# Patient Record
Sex: Male | Born: 1970 | Race: White | Hispanic: No | Marital: Married | Smoking: Never smoker
Health system: Southern US, Community
[De-identification: ages and names within clinical notes are randomized; demographics above are authoritative.]

## PROBLEM LIST (undated history)

## (undated) DIAGNOSIS — Z801 Family history of malignant neoplasm of trachea, bronchus and lung: Secondary | ICD-10-CM

## (undated) DIAGNOSIS — K219 Gastro-esophageal reflux disease without esophagitis: Secondary | ICD-10-CM

## (undated) DIAGNOSIS — T7840XA Allergy, unspecified, initial encounter: Secondary | ICD-10-CM

## (undated) DIAGNOSIS — K635 Polyp of colon: Secondary | ICD-10-CM

## (undated) DIAGNOSIS — J309 Allergic rhinitis, unspecified: Secondary | ICD-10-CM

## (undated) DIAGNOSIS — Z800 Family history of malignant neoplasm of digestive organs: Secondary | ICD-10-CM

## (undated) DIAGNOSIS — L719 Rosacea, unspecified: Secondary | ICD-10-CM

## (undated) DIAGNOSIS — E7800 Pure hypercholesterolemia, unspecified: Secondary | ICD-10-CM

## (undated) HISTORY — PX: UPPER GASTROINTESTINAL ENDOSCOPY: SHX188

## (undated) HISTORY — DX: Pure hypercholesterolemia, unspecified: E78.00

## (undated) HISTORY — DX: Gastro-esophageal reflux disease without esophagitis: K21.9

## (undated) HISTORY — DX: Family history of malignant neoplasm of digestive organs: Z80.0

## (undated) HISTORY — DX: Family history of malignant neoplasm of trachea, bronchus and lung: Z80.1

## (undated) HISTORY — DX: Rosacea, unspecified: L71.9

## (undated) HISTORY — PX: FINGER SURGERY: SHX640

## (undated) HISTORY — DX: Allergy, unspecified, initial encounter: T78.40XA

## (undated) HISTORY — PX: NASAL SINUS SURGERY: SHX719

## (undated) HISTORY — DX: Polyp of colon: K63.5

## (undated) HISTORY — DX: Allergic rhinitis, unspecified: J30.9

## (undated) HISTORY — PX: COLONOSCOPY: SHX174

---

## 2004-05-04 ENCOUNTER — Ambulatory Visit (HOSPITAL_COMMUNITY): Admission: RE | Admit: 2004-05-04 | Discharge: 2004-05-04 | Payer: Self-pay | Admitting: Internal Medicine

## 2004-09-15 HISTORY — PX: TONSILLECTOMY: SUR1361

## 2006-01-02 ENCOUNTER — Ambulatory Visit (HOSPITAL_BASED_OUTPATIENT_CLINIC_OR_DEPARTMENT_OTHER): Admission: RE | Admit: 2006-01-02 | Discharge: 2006-01-02 | Payer: Self-pay | Admitting: Otolaryngology

## 2006-01-02 ENCOUNTER — Encounter (INDEPENDENT_AMBULATORY_CARE_PROVIDER_SITE_OTHER): Payer: Self-pay | Admitting: *Deleted

## 2006-05-25 ENCOUNTER — Ambulatory Visit (HOSPITAL_COMMUNITY): Admission: RE | Admit: 2006-05-25 | Discharge: 2006-05-25 | Payer: Self-pay | Admitting: Internal Medicine

## 2006-11-16 ENCOUNTER — Ambulatory Visit (HOSPITAL_COMMUNITY): Admission: RE | Admit: 2006-11-16 | Discharge: 2006-11-16 | Payer: Self-pay | Admitting: Internal Medicine

## 2009-08-29 ENCOUNTER — Ambulatory Visit (HOSPITAL_COMMUNITY): Admission: RE | Admit: 2009-08-29 | Discharge: 2009-08-29 | Payer: Self-pay | Admitting: Internal Medicine

## 2011-01-31 NOTE — Op Note (Signed)
NAMEAKHIL, PISCOPO                 ACCOUNT NO.:  [id number]   MEDICAL RECORD NO.:  [id number]          PATIENT TYPE:  AMB   LOCATION:  DSC                          FACILITY:  [facility]   PHYSICIAN:  Lucky Cowboy, MD         DATE OF BIRTH:  07/10/1971   DATE OF PROCEDURE:  01/02/2006  DATE OF DISCHARGE:  01/02/2006                                 OPERATIVE REPORT   PREOPERATIVE DIAGNOSIS:  Septal deviation, bilateral inferior turbinate  hypertrophy, tonsillar hypertrophy.   POSTOPERATIVE DIAGNOSIS:  Septal deviation, bilateral inferior turbinate  hypertrophy, tonsillar hypertrophy.   PROCEDURE:  Septoplasty, bilateral inferior turbinate reductions,  tonsillectomy.   SURGEON:  Dr. Lucky Cowboy.   ANESTHESIA:  General.   ESTIMATED BLOOD LOSS:  Less than 20 cc.   SPECIMENS:  Bilateral palatine tonsils.   COMPLICATIONS:  None.   INDICATIONS:  The patient is a 40 year old male who was noted to have a  severe left septal deviation with obstructing inferior turbinate hypertrophy  causing nasal obstruction which failed to be relieved with medical therapy.  Further, he is noted to have an asymmetric right-sided tonsil with bilateral  tonsillar hypertrophy and halitosis from tonsillolith accumulation.  For  these reasons, the above procedures are performed.   FINDINGS:  The patient was noted to have a severe left septal deviation with  obstructing inferior turbinate hypertrophy had markedly enlarged tonsils  which were filled with tonsillolith.   PROCEDURE:  The patient was taken to the operating room and placed on the  table in the supine position.  He was then placed under general endotracheal  anesthesia and the nasal surgery performed.  The table was rotated  counterclockwise 90 degrees.  The nose was prepped with Betadine and draped  in the usual sterile fashion.  One percent lidocaine with 1:100,000  epinephrine was used to inject both sides of the nasal cavity.  After  allowing  time for vasoconstrictive effect, the left hemitransfixion incision  was made using a #15 blade.  Submucoperichondrial and mucoperiosteal flaps  were elevated.  The bony cartilaginous junction was divided using a Market researcher and contralateral flaps elevated.  Posterior portion of the septum  was carefully taken down using the open Jansen-Middleton forceps superiorly.  Takahashi forceps were used to take down redundant posterior nasal bone.  At  this point, the septal spur was taken down using the 4-mm osteotome.  This  allowed medialization of the septum.  The hemitransfixion incision was  closed using 4-0 chromic.  A horizontal mattress stitch was applied using 4-  0 chromic gut.  Attention was then turned to both of the inferior  turbinates.  Both these were injected with the same local anesthetic.  The  microdebrider was used to debride the inferior half of both the inferior  turbinates.  Redundant bone was taken down, leaving the superior half of the  inferior turbinates bilaterally.  The bone was taken down using the through-  cut forceps.  Suction cautery used for hemostasis.  Each side of the nasal  cavities was packed with Bactroban-coated  Telfa, tied to one another  anterior to the columella.  The oral surgery was performed next.  A Crowe-  Davis mouth gag with a #4 tongue blade was then placed intraorally, opened  and suspended on the Mayo stand.  The right palatine tonsil was grasped with  Allis clamps and directed inferior medially.  The Harmonic scalpel was then  used to excise the tonsil, staying within the peritonsillar space adjacent  to the tonsillar capsule.  The left palatine tonsil was removed in identical  fashion, and they were sent separately to pathology.  An NG tube was placed  on the esophagus for suctioning of the gastric contents.  The mouth gag was  removed noting no damage to the teeth or soft tissues.  Table was rotated  clockwise 90 degrees its original  position.  The patient was awakened from  anesthesia and taken to the post-anesthesia care unit in stable condition.  No complications.      Lucky Cowboy, MD  Electronically Signed     SJ/MEDQ  D:  02/05/2006  T:  02/06/2006  Job:  [id number]   cc:   [facility]

## 2015-05-23 DIAGNOSIS — J309 Allergic rhinitis, unspecified: Principal | ICD-10-CM

## 2015-05-23 DIAGNOSIS — K219 Gastro-esophageal reflux disease without esophagitis: Secondary | ICD-10-CM | POA: Insufficient documentation

## 2015-05-23 DIAGNOSIS — H1010 Acute atopic conjunctivitis, unspecified eye: Secondary | ICD-10-CM | POA: Insufficient documentation

## 2015-06-26 ENCOUNTER — Encounter (INDEPENDENT_AMBULATORY_CARE_PROVIDER_SITE_OTHER): Payer: Self-pay

## 2015-06-26 ENCOUNTER — Ambulatory Visit (INDEPENDENT_AMBULATORY_CARE_PROVIDER_SITE_OTHER): Payer: BLUE CROSS/BLUE SHIELD | Admitting: Allergy and Immunology

## 2015-06-26 ENCOUNTER — Encounter: Payer: Self-pay | Admitting: Allergy and Immunology

## 2015-06-26 VITALS — BP 128/82 | HR 80 | Temp 98.1°F | Resp 16 | Ht 75.59 in | Wt 220.2 lb

## 2015-06-26 DIAGNOSIS — J3089 Other allergic rhinitis: Secondary | ICD-10-CM | POA: Diagnosis not present

## 2015-06-26 NOTE — Progress Notes (Signed)
[place]  Follow-up Note    Subjective:   Barry Daniels is a 44 y.o. male who returns to the [facility] in re-evaluation of the following:  HPI Comments:  [name] returns to this clinic in re-evaluation of his allergic rhinitis presently treated with immunotherapy established 2005. He had an excellent response to immunotherapy and was able to extend his interval out to every four weeks. However over the past year he has redeveloped rhinitic symptoms that initially responded to montelukast but have progressed requiring him to also start a nasal steroid. There is no obvious trigger, environmental change, medication that can account for his increased disease activity.     Current outpatient prescriptions:  .  fexofenadine (ALLEGRA) 180 MG tablet, Take 180 mg by mouth daily., Disp: , Rfl:  .  Glucosamine-Chondroitin (GLUCOSAMINE CHONDR COMPLEX PO), Take 1 tablet by mouth daily., Disp: , Rfl:  .  montelukast (SINGULAIR) 10 MG tablet, Take 10 mg by mouth at bedtime., Disp: , Rfl:  .  omeprazole (PRILOSEC) 20 MG capsule, Take 1 capsule by mouth every morning., Disp: , Rfl: 0 .  triamcinolone (NASACORT ALLERGY 24HR) 55 MCG/ACT AERO nasal inhaler, Place 2 sprays into the nose daily., Disp: , Rfl:  .  mometasone (NASONEX) 50 MCG/ACT nasal spray, Place 2 sprays into the nose daily., Disp: , Rfl:   No orders of the defined types were placed in this encounter.    Past Medical History  Diagnosis Date  . Rosacea   . Allergic rhinitis   . Hypercholesteremia   . GERD (gastroesophageal reflux disease)     History reviewed. No pertinent past surgical history.  No Known Allergies  Review of Systems  HENT: Positive for congestion. Negative for nosebleeds and sore throat.   Eyes: Negative for discharge and redness.  Respiratory: Negative for cough.   Neurological: Negative for headaches.     Objective:   Filed Vitals:    06/26/15 0851  BP: 128/82  Pulse: 80  Temp: 98.1 F (36.7 C)  Resp: 16    Physical Exam  Constitutional: He is well-developed, well-nourished, and in no distress. No distress.  HENT:  Head: Normocephalic and atraumatic. Head is without right periorbital erythema and without left periorbital erythema.  Right Ear: Tympanic membrane, external ear and ear canal normal. No drainage. No foreign bodies. Tympanic membrane is not injected, not scarred, not perforated, not erythematous, not retracted and not bulging. No middle ear effusion.  Left Ear: Tympanic membrane, external ear and ear canal normal. No drainage. No foreign bodies. Tympanic membrane is not injected, not scarred, not perforated, not erythematous, not retracted and not bulging.  No middle ear effusion.  Nose: Nose normal. No mucosal edema, rhinorrhea, nose lacerations, sinus tenderness, nasal deformity, septal deviation or nasal septal hematoma. No epistaxis.  Mouth/Throat: Oropharynx is clear and moist and mucous membranes are normal. No oropharyngeal exudate, posterior oropharyngeal edema, posterior oropharyngeal erythema or tonsillar abscesses.  Eyes: Conjunctivae and lids are normal. Pupils are equal, round, and reactive to light. Right eye exhibits no discharge and no exudate. No foreign body present in the right eye. Left eye exhibits no discharge and no exudate. No foreign body present in the left eye. Right conjunctiva is not injected. Right conjunctiva has no hemorrhage. Left conjunctiva is not injected. Left conjunctiva has no hemorrhage. No scleral icterus.  Neck: No tracheal tenderness present. No tracheal deviation present.  Cardiovascular: Normal rate, regular rhythm,  S1 normal, S2 normal and normal heart sounds.  Exam reveals no gallop and no friction rub.   No murmur heard. Pulmonary/Chest: Effort normal. No respiratory distress. He has no wheezes. He has no rhonchi. He has no rales. He exhibits no tenderness.   Lymphadenopathy:    He has no cervical adenopathy.  Skin: No purpura and no rash noted. Rash is not macular, not maculopapular, not nodular, not pustular, not vesicular and not urticarial. He is not diaphoretic. No cyanosis or erythema. No pallor. Nails show no clubbing.  Psychiatric: Mood and affect normal.    Diagnostics: Allergy skin tests were performed and demonstrated positives to hickory, pecan, and cockroach on epicutaneous prick and dust mite on intradermal.     Assessment and Plan:   1. Other allergic rhinitis         1. Allergen avoidance measures  2. Treat inflammation with the following:   A. OTC Rhinocort / Nasocort - one - two sprays each nostril one time per day  B. Montelukast 10mg  - one tablet one time per day  3. If needed:   A. OTC antihistamine  B. Nasal saline   4. Continue immunotherapy and Epi-Pen. Reformulate with Dust Mite and hickory / pecan  5. Annual fall flu vaccine  6. Further evaluation?    Allena Katz, MD [place]

## 2015-06-26 NOTE — Patient Instructions (Signed)
  1. Allergen avoidance measures  2. Treat inflammation with the following:   A. OTC Rhinocort / Nasocort - one - two sprays each nostril one time per day  B. Montelukast 10mg  - one tablet one time per day  3. If needed:   A. OTC antihistamine  B. Nasal saline   4. Continue immunotherapy and Epi-Pen. May need re-formulation.  5. Annual fall flu vaccine  6. Further evaluation?

## 2015-07-03 ENCOUNTER — Other Ambulatory Visit: Payer: Self-pay | Admitting: Allergy and Immunology

## 2015-07-03 DIAGNOSIS — J3089 Other allergic rhinitis: Secondary | ICD-10-CM

## 2015-07-03 DIAGNOSIS — J309 Allergic rhinitis, unspecified: Secondary | ICD-10-CM

## 2015-07-05 DIAGNOSIS — J301 Allergic rhinitis due to pollen: Secondary | ICD-10-CM | POA: Diagnosis not present

## 2015-07-06 DIAGNOSIS — J3089 Other allergic rhinitis: Secondary | ICD-10-CM | POA: Diagnosis not present

## 2015-11-20 ENCOUNTER — Ambulatory Visit (INDEPENDENT_AMBULATORY_CARE_PROVIDER_SITE_OTHER): Payer: BLUE CROSS/BLUE SHIELD

## 2015-11-20 DIAGNOSIS — J309 Allergic rhinitis, unspecified: Secondary | ICD-10-CM | POA: Diagnosis not present

## 2015-11-20 NOTE — Progress Notes (Signed)
Immunotherapy   Patient Details  Name: Barry Daniels MRN: [id number] Date of Birth: November 14, 1970  11/20/2015  [name] started injections for Blue 1:100,000 (dmite and tree) @ .05 Following schedule: C   Frequency:1 time per week Epi-Pen:Epi-Pen Avaiable  Consent signed and patient instructions given.   [name] 11/20/2015, 9:41 AM

## 2016-01-31 ENCOUNTER — Ambulatory Visit (INDEPENDENT_AMBULATORY_CARE_PROVIDER_SITE_OTHER): Payer: BLUE CROSS/BLUE SHIELD

## 2016-01-31 DIAGNOSIS — J309 Allergic rhinitis, unspecified: Secondary | ICD-10-CM

## 2016-01-31 NOTE — Progress Notes (Signed)
Immunotherapy   Patient Details  Name: LEONIDES SWILLEY MRN: [id number] Date of Birth: Apr 10, 1971  01/31/2016  [name] A Deloach here to pick up GOLD 1:10,000 (DMITE & TREES) @ 0.10 GIVEN Following schedule: C  Frequency:1 time per week Epi-Pen:Epi-Pen Available  Consent signed and patient instructions given. NO PROBLEMS   Seraya Jobst 01/31/2016, 9:24 AM

## 2016-04-17 ENCOUNTER — Ambulatory Visit (INDEPENDENT_AMBULATORY_CARE_PROVIDER_SITE_OTHER): Payer: BLUE CROSS/BLUE SHIELD

## 2016-04-17 DIAGNOSIS — J309 Allergic rhinitis, unspecified: Secondary | ICD-10-CM

## 2016-04-17 NOTE — Progress Notes (Signed)
Immunotherapy   Patient Details  Name: Barry Daniels MRN: [id number] Date of Birth: [birth year]  04/17/2016  [name] here to pick up Green 1:1000 (DMite and Tree) Following schedule: C  Frequency:1 time per week Epi-Pen:Epi-Pen Available  Consent signed and patient instructions given.    Alisse Tuite 04/17/2016, 10:10 AM

## 2016-06-04 DIAGNOSIS — J301 Allergic rhinitis due to pollen: Secondary | ICD-10-CM | POA: Diagnosis not present

## 2016-06-05 DIAGNOSIS — J3089 Other allergic rhinitis: Secondary | ICD-10-CM | POA: Diagnosis not present

## 2016-06-12 ENCOUNTER — Telehealth: Payer: Self-pay | Admitting: *Deleted

## 2016-06-12 ENCOUNTER — Ambulatory Visit (INDEPENDENT_AMBULATORY_CARE_PROVIDER_SITE_OTHER): Payer: BLUE CROSS/BLUE SHIELD | Admitting: *Deleted

## 2016-06-12 DIAGNOSIS — J309 Allergic rhinitis, unspecified: Secondary | ICD-10-CM

## 2016-06-12 NOTE — Telephone Encounter (Signed)
Patient is on his first red vial and he wants to know if at the 0.50 can he go monthly. Please advise. Patient aware Dr. Neldon Mc is out of the office the rest of the week.

## 2016-06-16 NOTE — Telephone Encounter (Signed)
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

## 2016-06-17 NOTE — Telephone Encounter (Signed)
LM FOR PT TO CALL US BACK

## 2016-06-17 NOTE — Telephone Encounter (Signed)
DOCUMENTED IN PATIENTS CHART WILL NOTIFY PATIENT

## 2016-06-19 NOTE — Telephone Encounter (Signed)
Patient informed. 

## 2017-01-07 NOTE — Progress Notes (Signed)
Vials to be made 01-07-17 jm

## 2017-01-08 DIAGNOSIS — J3089 Other allergic rhinitis: Secondary | ICD-10-CM | POA: Diagnosis not present

## 2017-01-15 NOTE — Addendum Note (Signed)
Addended by: Felipa Emory on: 01/15/2017 01:49 PM   Modules accepted: Orders

## 2017-01-15 NOTE — Addendum Note (Signed)
Addended by: Felipa Emory on: 01/15/2017 12:22 PM   Modules accepted: Orders

## 2017-01-23 ENCOUNTER — Ambulatory Visit: Payer: BLUE CROSS/BLUE SHIELD

## 2017-01-23 ENCOUNTER — Ambulatory Visit (INDEPENDENT_AMBULATORY_CARE_PROVIDER_SITE_OTHER): Payer: BLUE CROSS/BLUE SHIELD

## 2017-01-23 DIAGNOSIS — J309 Allergic rhinitis, unspecified: Secondary | ICD-10-CM

## 2017-01-23 NOTE — Progress Notes (Signed)
Immunotherapy   Patient Details  Name: Barry Daniels MRN: [id number] Date of Birth: April 25, 1971  01/23/2017  [name] here to pick up  trees and d-mites Following schedule: C  Frequency:1 time per week Epi-Pen:Epi-Pen Available  Consent signed and patient instructions given.   [name] 01/23/2017, 10:46 AM

## 2017-04-03 ENCOUNTER — Telehealth: Payer: Self-pay

## 2017-04-03 NOTE — Telephone Encounter (Signed)
Patient called stating he broke one of his vials, he is unsure which one it was. Pt is in his red vial. He would like to just receive a whole new set of both vials so he can stay on schedule.   Patient would like to come in on August 3 @ 9 am. To pick up new vials. I have put the patient on the schedule for this date.  Please Advise

## 2017-04-06 NOTE — Telephone Encounter (Signed)
Can Someone Please Advise  Thanks

## 2017-04-07 NOTE — Telephone Encounter (Signed)
Added to vial list.

## 2017-04-07 NOTE — Telephone Encounter (Signed)
Left message letting patient know vials will be available for pick up at appointment.

## 2017-04-09 DIAGNOSIS — J301 Allergic rhinitis due to pollen: Secondary | ICD-10-CM | POA: Diagnosis not present

## 2017-04-09 NOTE — Telephone Encounter (Signed)
Left message to inform him that vials will be ready for pick up at appointment.

## 2017-04-10 DIAGNOSIS — J3089 Other allergic rhinitis: Secondary | ICD-10-CM | POA: Diagnosis not present

## 2017-04-17 ENCOUNTER — Ambulatory Visit: Payer: BLUE CROSS/BLUE SHIELD

## 2017-04-17 ENCOUNTER — Ambulatory Visit (INDEPENDENT_AMBULATORY_CARE_PROVIDER_SITE_OTHER): Payer: BLUE CROSS/BLUE SHIELD

## 2017-04-17 DIAGNOSIS — J309 Allergic rhinitis, unspecified: Secondary | ICD-10-CM

## 2017-04-17 NOTE — Progress Notes (Deleted)
Patient came by today to pick up his new allergy vials-Barry Daniels and Barry Daniels.  Exp: 04/10/2018  He received an Injection in office and waited 30 minutes with no issues. He brought out-of-office paperwork and was given a new set to take with him.

## 2017-04-17 NOTE — Progress Notes (Signed)
Immunotherapy   Patient Details  Name: Barry Daniels MRN: [id number] Date of Birth: Jan 20, 1971  04/17/2017  [name] A Mccaffery here to pick up  vials for [name] and [name]. Exp: 04/10/18 Following schedule: C  Frequency:1 time per week Epi-Pen:Epi-Pen Available   Pt waited 30 minutes with no issues. He was given out-of-office paperwork along with his vials.    [name] 04/17/2017, 9:22 AM

## 2017-11-10 NOTE — Progress Notes (Signed)
VIALS EXP 11-11-18

## 2017-11-11 DIAGNOSIS — J301 Allergic rhinitis due to pollen: Secondary | ICD-10-CM | POA: Diagnosis not present

## 2017-11-17 ENCOUNTER — Ambulatory Visit (INDEPENDENT_AMBULATORY_CARE_PROVIDER_SITE_OTHER): Payer: BLUE CROSS/BLUE SHIELD | Admitting: *Deleted

## 2017-11-17 DIAGNOSIS — J309 Allergic rhinitis, unspecified: Secondary | ICD-10-CM

## 2017-11-23 NOTE — Progress Notes (Signed)
Immunotherapy   Patient Details  Name: Barry Daniels MRN: [id number] Date of Birth: 1970-10-09  11/19/2017  [name] A Hoxworth picked up vials for [place]. Following schedule: C  Frequency:1 time per week @ 0.50 every 4 weeks. Epi-Pen:Epi-Pen Available  Consent signed and patient instructions given. 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   Horris Latino 11/19/2017, 10:47 AM

## 2018-06-09 ENCOUNTER — Telehealth: Payer: Self-pay

## 2018-06-09 NOTE — Telephone Encounter (Signed)
Patient called and stated that he will need new vials soon. Patient scheduled an appointment to pick his vials up on 06/25/2018 at 9am

## 2018-06-10 NOTE — Progress Notes (Signed)
Vials exp 06-11-19

## 2018-06-11 DIAGNOSIS — J301 Allergic rhinitis due to pollen: Secondary | ICD-10-CM | POA: Diagnosis not present

## 2018-06-25 ENCOUNTER — Ambulatory Visit (INDEPENDENT_AMBULATORY_CARE_PROVIDER_SITE_OTHER): Payer: BLUE CROSS/BLUE SHIELD

## 2018-06-25 DIAGNOSIS — J309 Allergic rhinitis, unspecified: Secondary | ICD-10-CM

## 2018-06-25 NOTE — Progress Notes (Signed)
Immunotherapy   Patient Details  Name: Barry Daniels MRN: [id number] Date of Birth: [birth year]  06/25/2018   Barry Daniels picked up vials for [facility] & Dmite. Patient did not receive an injection today as he was not due yet. Following schedule: C  Frequency: Weekly then at 0.50 every 4 weeks. Epi-Pen: Yes Consent signed and patient instructions given. 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   Herbie Drape 06/25/2018, 11:07 AM

## 2018-07-28 ENCOUNTER — Other Ambulatory Visit: Payer: Self-pay | Admitting: Internal Medicine

## 2018-07-28 DIAGNOSIS — M25562 Pain in left knee: Secondary | ICD-10-CM

## 2018-08-03 ENCOUNTER — Ambulatory Visit
Admission: RE | Admit: 2018-08-03 | Discharge: 2018-08-03 | Disposition: A | Discharge status: Home / Self Care | Payer: BLUE CROSS/BLUE SHIELD | Source: Ambulatory Visit | Attending: Internal Medicine | Admitting: Internal Medicine

## 2018-08-03 DIAGNOSIS — M25562 Pain in left knee: Secondary | ICD-10-CM

## 2019-02-09 DIAGNOSIS — J301 Allergic rhinitis due to pollen: Secondary | ICD-10-CM

## 2019-02-09 NOTE — Progress Notes (Signed)
Vials exp 02-09-2020

## 2019-02-15 ENCOUNTER — Ambulatory Visit: Payer: BLUE CROSS/BLUE SHIELD | Admitting: *Deleted

## 2019-02-15 ENCOUNTER — Other Ambulatory Visit: Payer: Self-pay

## 2019-02-15 DIAGNOSIS — J309 Allergic rhinitis, unspecified: Secondary | ICD-10-CM

## 2019-02-15 NOTE — Progress Notes (Signed)
Immunotherapy   Patient Details  Name: DONAVON KIMREY MRN: [id number] Date of Birth: 1971-09-06  02/15/2019   [name] A Perinipicked up vials for [facility] & Dmite. Patient did not receive an injection today as he was not due yet. Following schedule:C Frequency: Weekly then at 0.50 every 4 weeks. 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     Rainen Vanrossum Fernandez-Vernon 02/15/2019, 9:41 AM

## 2019-06-20 ENCOUNTER — Other Ambulatory Visit: Payer: Self-pay | Admitting: Internal Medicine

## 2019-06-20 DIAGNOSIS — E785 Hyperlipidemia, unspecified: Secondary | ICD-10-CM

## 2019-08-29 ENCOUNTER — Telehealth: Payer: Self-pay

## 2019-08-29 NOTE — Telephone Encounter (Signed)
Patient called and stated that he is needing new red vials. Patient scheduled appt for 09/22/2019 to pick up vials. He has another appt that morning and may call to schedule a different time. Patient stated he will bring his injection records when he picks up vials.

## 2019-08-30 NOTE — Progress Notes (Signed)
VIALS EXP 08-29-20

## 2019-08-31 DIAGNOSIS — J301 Allergic rhinitis due to pollen: Secondary | ICD-10-CM

## 2019-09-22 ENCOUNTER — Ambulatory Visit: Payer: Self-pay | Admitting: *Deleted

## 2019-09-22 ENCOUNTER — Other Ambulatory Visit: Payer: Self-pay

## 2019-09-22 ENCOUNTER — Ambulatory Visit
Admission: RE | Admit: 2019-09-22 | Discharge: 2019-09-22 | Disposition: A | Discharge status: Home / Self Care | Payer: No Typology Code available for payment source | Source: Ambulatory Visit | Attending: Internal Medicine | Admitting: Internal Medicine

## 2019-09-22 DIAGNOSIS — E785 Hyperlipidemia, unspecified: Secondary | ICD-10-CM

## 2019-09-22 DIAGNOSIS — J309 Allergic rhinitis, unspecified: Secondary | ICD-10-CM

## 2019-09-22 NOTE — Progress Notes (Signed)
Immunotherapy   Patient Details  Name: Barry Daniels MRN: [id number] Date of Birth: [birth year]  09/22/2019  Patient Details  Name: Barry Daniels MRN: [id number] Date of Birth: 11-08-1970  [name] A Perinipicked up vials forTree &Dmite. Patient did not receive an injection today. Following schedule:C Frequency:Weekly then at0.50 every 4 weeks. 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   Vidhi Delellis Fernandez-Vernon 09/22/2019, 9:27 AM

## 2019-10-10 DIAGNOSIS — R509 Fever, unspecified: Secondary | ICD-10-CM | POA: Diagnosis not present

## 2019-10-24 DIAGNOSIS — R52 Pain, unspecified: Secondary | ICD-10-CM | POA: Diagnosis not present

## 2019-11-29 ENCOUNTER — Other Ambulatory Visit: Payer: Self-pay

## 2019-11-29 ENCOUNTER — Encounter: Payer: Self-pay | Admitting: Allergy and Immunology

## 2019-11-29 ENCOUNTER — Ambulatory Visit (INDEPENDENT_AMBULATORY_CARE_PROVIDER_SITE_OTHER): Payer: BC Managed Care – PPO | Admitting: Allergy and Immunology

## 2019-11-29 VITALS — BP 130/78 | HR 64 | Temp 97.2°F | Resp 18 | Ht 75.0 in | Wt 226.0 lb

## 2019-11-29 DIAGNOSIS — J3089 Other allergic rhinitis: Secondary | ICD-10-CM

## 2019-11-29 DIAGNOSIS — J301 Allergic rhinitis due to pollen: Secondary | ICD-10-CM

## 2019-11-29 NOTE — Patient Instructions (Addendum)
  1.  Continue immunotherapy  2.  Continue Nasonex /Nasacort /Rhinocort -1-2 sprays each nostril daily   3.  Continue montelukast 10 mg daily  4.  Continue OTC antihistamine if needed  5.  Return to clinic in 1 year or earlier if problem

## 2019-11-29 NOTE — Progress Notes (Signed)
[place] [facility] [place] [facility] [place]   Follow-up Note  Referring Provider: Prince Solian, MD Primary Provider: Prince Solian, MD Date of Office Visit: 11/29/2019  Subjective:   Barry Daniels (DOB: [birth year]) is a 49 y.o. 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  He was last seen in this clinic on 26 June 2015.  He is receiving immunotherapy without any adverse effects.  A combination of immunotherapy and nasal steroid and montelukast has resulted in very good control of his atopic disease during the majority of the year.  However, there is a 5-week interval that spans January and February of each year, over the course of the past 3 years, that is giving rise to significant problems with rhinitis.  He will develop nasal congestion and postnasal drip and throat clearing and some sore throat and fatigue during this timeframe.  He has received the flu vaccine and 2 Pfizer Covid vaccinations.  Allergies as of 11/29/2019      Reactions   Statins Other (See Comments)   Drug intolerance      Medication List      CoQ10 200 MG Caps Take 1 capsule by mouth daily.   fexofenadine 180 MG tablet Commonly known as: ALLEGRA Take 180 mg by mouth daily.   GLUCOSAMINE CHONDR COMPLEX PO Take 1 tablet by mouth daily.   mometasone 50 MCG/ACT nasal spray Commonly known as: NASONEX Place 2 sprays into the nose daily.   montelukast 10 MG tablet Commonly known as: SINGULAIR Take 10 mg by mouth at bedtime.   Nasacort Allergy 24HR 55 MCG/ACT Aero nasal inhaler Generic drug: triamcinolone Place 2 sprays into the nose daily.   omeprazole 20 MG capsule Commonly known as: PRILOSEC Take 1 capsule by mouth every morning.       Past Medical History:  Diagnosis Date  . Allergic rhinitis   . GERD (gastroesophageal reflux disease)    . Hypercholesteremia   . Rosacea     History reviewed. No pertinent surgical history.  Review of systems negative except as noted in HPI / PMHx or noted below:  Review of Systems  Constitutional: Negative.   HENT: Negative.   Eyes: Negative.   Respiratory: Negative.   Cardiovascular: Negative.        Currently using a monoclonal antibody in the treatment of elevated LDL and a history of coronary artery disease and a disadvantageous CT cardiac calcification scale reading.  Gastrointestinal: Negative.   Genitourinary: Negative.   Musculoskeletal: Negative.   Skin: Negative.   Neurological: Negative.   Endo/Heme/Allergies: Negative.   Psychiatric/Behavioral: Negative.      Objective:   Vitals:   11/29/19 1120  BP: 130/78  Pulse: 64  Resp: 18  Temp: (!) 97.2 F (36.2 C)  SpO2: 98%   Height: 6\' 3"  (190.5 cm)  Weight: 226 lb (102.5 kg)   Physical Exam Constitutional:      Appearance: He is not diaphoretic.  HENT:     Head: Normocephalic.     Right Ear: Tympanic membrane, ear canal and external ear normal.     Left Ear: Tympanic membrane, ear canal and external ear normal.     Nose: Nose normal. No mucosal edema or rhinorrhea.     Mouth/Throat:     Pharynx: Uvula midline. No oropharyngeal exudate.  Eyes:     Conjunctiva/sclera: Conjunctivae normal.  Neck:     Thyroid: No thyromegaly.     Trachea: Trachea normal. No tracheal tenderness or tracheal deviation.  Cardiovascular:     Rate and Rhythm: Normal rate and regular rhythm.     Heart sounds: Normal heart sounds, S1 normal and S2 normal. No murmur.  Pulmonary:     Effort: No respiratory distress.     Breath sounds: Normal breath sounds. No stridor. No wheezing or rales.  Lymphadenopathy:     Head:     Right side of head: No tonsillar adenopathy.     Left side of head: No tonsillar adenopathy.     Cervical: No cervical adenopathy.  Skin:    Findings: No erythema or rash.     Nails: There is no clubbing.    Neurological:     Mental Status: He is alert.     Diagnostics: none  Assessment and Plan:   1. Perennial allergic rhinitis   2. Seasonal allergic rhinitis due to pollen     1.  Continue immunotherapy  2.  Continue Nasonex /Nasacort /Rhinocort -1-2 sprays each nostril daily   3.  Continue montelukast 10 mg daily  4.  Continue OTC antihistamine if needed  5.  Return to clinic in 1 year or earlier if problem  Overall Barry Daniels appears to be doing relatively well with his atopic condition on his current therapy other than developing what appears to be some degree of respiratory tract inflammation most likely as a result of cedar / juniper pollen exposure during late January and early February of each year.  I had a discussion with him about this issue today.  He may want to consider taking a 1 week vacation to an area where there is no cedar / juniper exposure during that time of the year to break the inflammatory cycle that results from this exposure.  He may also want to consider taking a low-dose of prednisone such as 10 mg daily for 5 to 10 days during that time of the year to break this inflammatory cycle.  He will keep in contact with [place] noting his response to this approach.  Allena Katz, MD Allergy / Immunology [place]

## 2019-11-30 ENCOUNTER — Encounter: Payer: Self-pay | Admitting: Allergy and Immunology

## 2020-02-01 DIAGNOSIS — E7849 Other hyperlipidemia: Secondary | ICD-10-CM | POA: Diagnosis not present

## 2020-04-30 DIAGNOSIS — Z20828 Contact with and (suspected) exposure to other viral communicable diseases: Secondary | ICD-10-CM | POA: Diagnosis not present

## 2020-05-03 DIAGNOSIS — Z20828 Contact with and (suspected) exposure to other viral communicable diseases: Secondary | ICD-10-CM | POA: Diagnosis not present

## 2020-05-16 ENCOUNTER — Encounter: Payer: Self-pay | Admitting: Gastroenterology

## 2020-06-26 ENCOUNTER — Telehealth: Payer: Self-pay

## 2020-06-26 NOTE — Telephone Encounter (Signed)
Patient is picking up new red vials 07-25-20. I just wanted to give you a heads up. Thank you.

## 2020-06-27 DIAGNOSIS — J301 Allergic rhinitis due to pollen: Secondary | ICD-10-CM | POA: Diagnosis not present

## 2020-06-27 NOTE — Progress Notes (Signed)
Vials exp 06-27-21

## 2020-07-25 ENCOUNTER — Ambulatory Visit (INDEPENDENT_AMBULATORY_CARE_PROVIDER_SITE_OTHER): Payer: BC Managed Care – PPO | Admitting: *Deleted

## 2020-07-25 ENCOUNTER — Ambulatory Visit (INDEPENDENT_AMBULATORY_CARE_PROVIDER_SITE_OTHER): Payer: BC Managed Care – PPO | Admitting: Gastroenterology

## 2020-07-25 ENCOUNTER — Encounter: Payer: Self-pay | Admitting: Gastroenterology

## 2020-07-25 VITALS — BP 108/66 | HR 80 | Ht 75.5 in | Wt 223.0 lb

## 2020-07-25 DIAGNOSIS — K219 Gastro-esophageal reflux disease without esophagitis: Secondary | ICD-10-CM

## 2020-07-25 DIAGNOSIS — J309 Allergic rhinitis, unspecified: Secondary | ICD-10-CM | POA: Diagnosis not present

## 2020-07-25 DIAGNOSIS — Z1211 Encounter for screening for malignant neoplasm of colon: Secondary | ICD-10-CM | POA: Diagnosis not present

## 2020-07-25 MED ORDER — PLENVU 140 G PO SOLR: 1.0000 | Freq: Once | ORAL | 0 refills | Status: AC

## 2020-07-25 NOTE — Progress Notes (Signed)
HPI :  49 year old male with a history of GERD, hyperlipidemia, here for new patient visit to discuss colon cancer screening and reflux, possible endoscopy.   He endorses having symptoms of GERD starting in age 57s.  States he has had symptoms of pyrosis and regurgitation for years.  Had tonsillectomy at age 54, thought reflux could be making that worse as well.  He has been on omeprazole for more than 15 years at this point.  Currently takes omeprazole 20 mg twice daily.  It works pretty well for him, denies any significant breakthrough symptoms that bother him on the regimen.  No dysphagia.  No nausea, no vomiting.  No upper abdominal pain.  No family history of Barrett's esophagus or esophageal cancer.  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   He denies any problems with his bowels on a routine basis.  He has had intermittent mild hemorrhoid symptoms since age 65s or so.  He eats high-fiber cereal which generally controls his symptoms.  He denies any blood in his stools.  No family history of colon cancer.  He has never had a prior colonoscopy or endoscopy.  Past Medical History:  Diagnosis Date  . Allergic rhinitis   . GERD (gastroesophageal reflux disease)   . Hypercholesteremia   . Rosacea      Past Surgical History:  Procedure Laterality Date  . TONSILLECTOMY  2006   Family History  Problem Relation Age of Onset  . Allergic rhinitis Mother   . Hyperlipidemia Mother   . Depression Mother   . Allergic rhinitis Sister   . Liver cancer Father 30       bile duct  . Hypertension Father   . Hyperlipidemia Father    Social History   Tobacco Use  . Smoking status: Never Smoker  . Smokeless tobacco: Never Used  Vaping Use  . Vaping Use: Never used  Substance Use Topics  . Alcohol use: Yes    Comment: 2 per day  . Drug use: Never   Current Outpatient Medications  Medication Sig Dispense Refill  .  AMBULATORY NON FORMULARY MEDICATION Allergy injections  Once a month    . EPINEPHrine 0.3 mg/0.3 mL IJ SOAJ injection Inject 0.3 mg into the muscle as needed for anaphylaxis.    . fexofenadine (ALLEGRA) 180 MG tablet Take 180 mg by mouth daily.    . montelukast (SINGULAIR) 10 MG tablet Take 10 mg by mouth at bedtime.    Marland Kitchen omeprazole (PRILOSEC) 20 MG capsule Take 1 capsule by mouth 2 (two) times daily before a meal.   0  . triamcinolone (NASACORT ALLERGY 24HR) 55 MCG/ACT AERO nasal inhaler Place 2 sprays into the nose daily.    . TURMERIC PO Take 1 tablet by mouth daily.    . cholecalciferol (VITAMIN D) 25 MCG (1000 UNIT) tablet Take 1,000 Units by mouth daily. (Patient not taking: Reported on 07/25/2020)     No current facility-administered medications for this visit.   Allergies  Allergen Reactions  . Cashew Nut (Anacardium Occidentale) Skin Test Hives  . Statins Other (See Comments)    Drug intolerance     Review of Systems: All systems reviewed and negative except where noted in HPI.   No labs on file today, denies history of CKD or anemia  Physical Exam: BP 108/66 (BP Location: Left Arm, Patient Position: Sitting, Cuff Size: Normal)   Pulse 80  Ht 6' 3.5" (1.918 m) Comment: height measured without shoes  Wt 223 lb (101.2 kg)   BMI 27.51 kg/m  Constitutional: Pleasant,well-developed, male in no acute distress. HEENT: Normocephalic and atraumatic. Conjunctivae are normal. No scleral icterus.  Cardiovascular: Normal rate, regular rhythm.  Pulmonary/chest: Effort normal and breath sounds normal.  Abdominal: Soft, nondistended, nontender. There are no masses palpable.  Extremities: no edema Neurological: Alert and oriented to person place and time. Skin: Skin is warm and dry. No rashes noted. Psychiatric: Normal mood and affect. Behavior is normal.   ASSESSMENT AND PLAN: 49 year old male here for new patient visit to discuss the following:  GERD - longstanding symptoms  of reflux that are well controlled with omeprazole twice daily. No alarm symptoms. Doing well on this regimen and tolerates it well.  We did discuss long-term risks/ benefits of chronic PPI use, as well as other options to treat reflux. Will continue PPI for now.  Given the duration of his symptoms, ethnicity, gender, age, I offered him an endoscopy to screen for Barrett's esophagus.  I discussed endoscopy with him, risk benefits of this as well as anesthesia, and he wanted to proceed.  We will do this at the same time as a screening colonoscopy as outlined below.  Further recommendations pending the results.  Colon cancer screening - we discussed options for this, stool testing vs. colonoscopy.  Recommend optical colonoscopy as first line option.  Following discussion of risk and benefits he wanted to proceed.  Further recommendations pending results of this exam. Discussed hemorrhoids otherwise, only mild intermittent symptoms, generally managed with diet, no additional therapy needed at this time.   [place] Cellar, MD [facility]  CC: Prince Solian, MD

## 2020-07-25 NOTE — Progress Notes (Signed)
Immunotherapy   Patient Details  Name: Barry Daniels MRN: [id number] Date of Birth: [birth year]  07/25/2020  Barry Daniels here to pick up  new red vials for dustmite and trees expiring 06/27/21- pt waiting 30 minutes with no problems. 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  Following schedule: C  Frequency:1 time per week Epi-Pen:Prescription for Epi-Pen given Consent signed and patient instructions given.   Barry Daniels 07/25/2020, 11:18 AM

## 2020-07-25 NOTE — Patient Instructions (Addendum)
If you are age 49 or older, your body mass index should be between 23-30. Your Body mass index is 27.51 kg/m. If this is out of the aforementioned range listed, please consider follow up with your Primary Care Provider.  If you are age 24 or younger, your body mass index should be between 19-25. Your Body mass index is 27.51 kg/m. If this is out of the aformentioned range listed, please consider follow up with your Primary Care Provider.   You have been scheduled for an endoscopy and colonoscopy. Please follow the written instructions given to you at your visit today. Please pick up your prep supplies at the pharmacy within the next 1-3 days. If you use inhalers (even only as needed), please bring them with you on the day of your procedure.  We are giving you a sample of Plenvu today to use to prep for your procedure. Please follow the instructions given to you today and NOT on the box.   Thank you for entrusting me with your care and for choosing [facility], Dr. Coleman Cellar

## 2020-09-25 ENCOUNTER — Other Ambulatory Visit: Payer: Self-pay

## 2020-09-25 ENCOUNTER — Encounter: Payer: Self-pay | Admitting: Gastroenterology

## 2020-09-25 ENCOUNTER — Ambulatory Visit (AMBULATORY_SURGERY_CENTER): Payer: BC Managed Care – PPO | Admitting: Gastroenterology

## 2020-09-25 VITALS — BP 94/40 | HR 53 | Temp 97.7°F | Resp 12 | Ht 75.5 in | Wt 223.0 lb

## 2020-09-25 DIAGNOSIS — K317 Polyp of stomach and duodenum: Secondary | ICD-10-CM

## 2020-09-25 DIAGNOSIS — Z1211 Encounter for screening for malignant neoplasm of colon: Secondary | ICD-10-CM

## 2020-09-25 DIAGNOSIS — D120 Benign neoplasm of cecum: Secondary | ICD-10-CM | POA: Diagnosis not present

## 2020-09-25 DIAGNOSIS — K219 Gastro-esophageal reflux disease without esophagitis: Secondary | ICD-10-CM

## 2020-09-25 DIAGNOSIS — D122 Benign neoplasm of ascending colon: Secondary | ICD-10-CM

## 2020-09-25 DIAGNOSIS — C189 Malignant neoplasm of colon, unspecified: Secondary | ICD-10-CM | POA: Diagnosis not present

## 2020-09-25 DIAGNOSIS — D123 Benign neoplasm of transverse colon: Secondary | ICD-10-CM | POA: Diagnosis not present

## 2020-09-25 MED ORDER — SODIUM CHLORIDE 0.9 % IV SOLN: 500.0000 mL | Freq: Once | INTRAVENOUS | Status: DC

## 2020-09-25 NOTE — Progress Notes (Signed)
VS-SP 

## 2020-09-25 NOTE — Patient Instructions (Signed)
Please, read all of the handouts given to you by your recovery room nurse.    YOU HAD AN ENDOSCOPIC PROCEDURE TODAY AT [facility]:   Refer to the procedure report that was given to you for any specific questions about what was found during the examination.  If the procedure report does not answer your questions, please call your gastroenterologist to clarify.  If you requested that your care partner not be given the details of your procedure findings, then the procedure report has been included in a sealed envelope for you to review at your convenience later.  YOU SHOULD EXPECT: Some feelings of bloating in the abdomen. Passage of more gas than usual.  Walking can help get rid of the air that was put into your GI tract during the procedure and reduce the bloating. If you had a lower endoscopy (such as a colonoscopy or flexible sigmoidoscopy) you may notice spotting of blood in your stool or on the toilet paper. If you underwent a bowel prep for your procedure, you may not have a normal bowel movement for a few days.  Please Note:  You might notice some irritation and congestion in your nose or some drainage.  This is from the oxygen used during your procedure.  There is no need for concern and it should clear up in a day or so.  SYMPTOMS TO REPORT IMMEDIATELY:   Following lower endoscopy (colonoscopy or flexible sigmoidoscopy):  Excessive amounts of blood in the stool  Significant tenderness or worsening of abdominal pains  Swelling of the abdomen that is new, acute  Fever of 100F or higher   Following upper endoscopy (EGD)  Vomiting of blood or coffee ground material  New chest pain or pain under the shoulder blades  Painful or persistently difficult swallowing  New shortness of breath  Fever of 100F or higher  Black, tarry-looking stools  For urgent or emergent issues, a gastroenterologist can be reached at any hour by calling [phone]. Do not use MyChart  messaging for urgent concerns.    DIET:  We do recommend a small meal at first, but then you may proceed to your regular diet.  Drink plenty of fluids but you should avoid alcoholic beverages for 24 hours.  ACTIVITY:  You should plan to take it easy for the rest of today and you should NOT DRIVE or use heavy machinery until tomorrow (because of the sedation medicines used during the test).    FOLLOW UP: Our staff will call the number listed on your records 48-72 hours following your procedure to check on you and address any questions or concerns that you may have regarding the information given to you following your procedure. If we do not reach you, we will leave a message.  We will attempt to reach you two times.  During this call, we will ask if you have developed any symptoms of COVID 19. If you develop any symptoms (ie: fever, flu-like symptoms, shortness of breath, cough etc.) before then, please call [phone].  If you test positive for Covid 19 in the 2 weeks post procedure, please call and report this information to [place].    If any biopsies were taken you will be contacted by phone or by letter within the next 1-3 weeks.  Please call us at [phone] if you have not heard about the biopsies in 3 weeks.    SIGNATURES/CONFIDENTIALITY: You and/or your care partner have signed paperwork which will be entered into  your electronic medical record.  These signatures attest to the fact that that the information above on your After Visit Summary has been reviewed and is understood.  Full responsibility of the confidentiality of this discharge information lies with you and/or your care-partner.

## 2020-09-25 NOTE — Progress Notes (Signed)
pt tolerated well. VSS. awake and to recovery. Report given to RN. Bite block used without trauma. 

## 2020-09-25 NOTE — Progress Notes (Signed)
Patient is not waking up.  Patient's wife is with him.  Vitals are stable.  IV in.

## 2020-09-25 NOTE — Op Note (Signed)
[facility] Patient Name: Barry Daniels Procedure Date: 09/25/2020 1:34 PM MRN: [id number] Endoscopist: Remo Lipps P. Havery Moros , MD Age: 50 Referring MD:  Date of Birth: 11-11-1970 Gender: Male Account #: [id number] Procedure:                Colonoscopy Indications:              Screening for colorectal malignant neoplasm, This                            is the patient's first colonoscopy Medicines:                Monitored Anesthesia Care Procedure:                Pre-Anesthesia Assessment:                           - Prior to the procedure, a History and Physical                            was performed, and patient medications and                            allergies were reviewed. The patient's tolerance of                            previous anesthesia was also reviewed. The risks                            and benefits of the procedure and the sedation                            options and risks were discussed with the patient.                            All questions were answered, and informed consent                            was obtained. Prior Anticoagulants: The patient has                            taken no previous anticoagulant or antiplatelet                            agents. ASA Grade Assessment: II - A patient with                            mild systemic disease. After reviewing the risks                            and benefits, the patient was deemed in                            satisfactory condition to undergo the procedure.  After obtaining informed consent, the colonoscope                            was passed under direct vision. Throughout the                            procedure, the patient's blood pressure, pulse, and                            oxygen saturations were monitored continuously. The                            Olympus CF-HQ190 [id number]) Colonoscope was                            introduced through the anus  and advanced to the the                            cecum, identified by appendiceal orifice and                            ileocecal valve. The colonoscopy was performed                            without difficulty. The patient tolerated the                            procedure well. The quality of the bowel                            preparation was good. The ileocecal valve,                            appendiceal orifice, and rectum were photographed. Scope In: 1:49:25 PM Scope Out: 2:30:16 PM Scope Withdrawal Time: 0 hours 35 minutes 0 seconds  Total Procedure Duration: 0 hours 40 minutes 51 seconds  Findings:                 The perianal and digital rectal examinations were                            normal.                           A 3 to 4 mm polyp was found in the cecum. The polyp                            was flat. The polyp was removed with a cold snare.                            Resection and retrieval were complete.                           A diminutive polyp was found in the ascending  colon. The polyp was sessile. The polyp could not                            be grasped with snare due to looping and difficult                            positioning of it, it was eventually removed with a                            cold biopsy forceps. Resection and retrieval were                            complete.                           A 4 to 5 mm polyp was found in the transverse                            colon. The polyp was sessile. The polyp was removed                            with a cold snare. Resection and retrieval were                            complete.                           The colon was redundant with some looping in the                            right colon.                           Internal hemorrhoids were found during                            retroflexion. The hemorrhoids were small.                           The exam was  otherwise without abnormality. Complications:            No immediate complications. Estimated blood loss:                            Minimal. Estimated Blood Loss:     Estimated blood loss was minimal. Impression:               - One 3 to 4 mm polyp in the cecum, removed with a                            cold snare. Resected and retrieved.                           - One diminutive polyp in the ascending colon,  removed with a cold biopsy forceps. Resected and                            retrieved.                           - One 4 to 5 mm polyp in the transverse colon,                            removed with a cold snare. Resected and retrieved.                           - Redundant colon.                           - Internal hemorrhoids.                           - The examination was otherwise normal. Recommendation:           - Patient has a contact number available for                            emergencies. The signs and symptoms of potential                            delayed complications were discussed with the                            patient. Return to normal activities tomorrow.                            Written discharge instructions were provided to the                            patient.                           - Resume previous diet.                           - Continue present medications.                           - Await pathology results. Remo Lipps P. Lelania Bia, MD 09/25/2020 2:35:39 PM This report has been signed electronically.

## 2020-09-25 NOTE — Op Note (Signed)
[facility] Patient Name: Christoper Bushey Procedure Date: 09/25/2020 1:34 PM MRN: [id number] Endoscopist: Remo Lipps P. Havery Moros , MD Age: 50 Referring MD:  Date of Birth: April 17, 1971 Gender: Male Account #: [id number] Procedure:                Upper GI endoscopy Indications:              Screening for Barrett's esophagus, Follow-up of                            gastro-esophageal reflux disease - on omeprazole Medicines:                Monitored Anesthesia Care Procedure:                Pre-Anesthesia Assessment:                           - Prior to the procedure, a History and Physical                            was performed, and patient medications and                            allergies were reviewed. The patient's tolerance of                            previous anesthesia was also reviewed. The risks                            and benefits of the procedure and the sedation                            options and risks were discussed with the patient.                            All questions were answered, and informed consent                            was obtained. Prior Anticoagulants: The patient has                            taken no previous anticoagulant or antiplatelet                            agents. ASA Grade Assessment: II - A patient with                            mild systemic disease. After reviewing the risks                            and benefits, the patient was deemed in                            satisfactory condition to undergo the procedure.  After obtaining informed consent, the endoscope was                            passed under direct vision. Throughout the                            procedure, the patient's blood pressure, pulse, and                            oxygen saturations were monitored continuously. The                            Endoscope was introduced through the mouth, and                            advanced  to the second part of duodenum. The upper                            GI endoscopy was accomplished without difficulty.                            The patient tolerated the procedure well. Scope In: Scope Out: Findings:                 Esophagogastric landmarks were identified: the                            Z-line was found at 42 cm, the gastroesophageal                            junction was found at 42 cm and the upper extent of                            the gastric folds was found at 42 cm from the                            incisors.                           The exam of the esophagus was otherwise normal. No                            evidence of Barrett's esophagus.                           A few small benign appearing sessile polyps were                            found in the gastric body, ranging from 3-41mm in                            size. 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 The two largest polyps were                            removed with a cold biopsy forceps. Resection and                            retrieval were complete.                           The exam of the stomach was otherwise normal.                           The duodenal bulb and second portion of the                            duodenum were normal. Complications:            No immediate complications. Estimated blood loss:                            Minimal. Estimated Blood Loss:     Estimated blood loss was minimal. Impression:               - Esophagogastric landmarks identified.                           - Normal esophagus otherwise - no evidence of                            Barrett's esophagus.                           - A few benign appearing gastric polyps, suspect                            fundic gland polyps. 2 representative polyps                            resected and retrieved.                           - Normal stomach otherwise.                            - Normal duodenal bulb and second portion of the                            duodenum. Recommendation:           - Patient has a contact number available for                            emergencies. The signs and symptoms of potential                            delayed complications were discussed with the  patient. Return to normal activities tomorrow.                            Written discharge instructions were provided to the                            patient.                           - Resume previous diet.                           - Continue present medications.                           - Await pathology results. Viviann Spare P. Armbruster, MD 09/25/2020 2:40:17 PM This report has been signed electronically.

## 2020-09-27 ENCOUNTER — Telehealth: Payer: Self-pay

## 2020-09-27 NOTE — Telephone Encounter (Signed)
  Follow up Call-  Call back number 09/25/2020  Post procedure Call Back phone  # [phone]  Permission to leave phone message Yes  Some recent data might be hidden     Patient questions:  Do you have a fever, pain , or abdominal swelling? No. Pain Score  0 *  Have you tolerated food without any problems? Yes.    Have you been able to return to your normal activities? Yes.    Do you have any questions about your discharge instructions: Diet   No. Medications  No. Follow up visit  No.  Do you have questions or concerns about your Care? No.  Actions: * If pain score is 4 or above: No action needed, pain <4. 1. Have you developed a fever since your procedure? no  2.   Have you had an respiratory symptoms (SOB or cough) since your procedure? no  3.   Have you tested positive for COVID 19 since your procedure no  4.   Have you had any family members/close contacts diagnosed with the COVID 19 since your procedure?  no   If yes to any of these questions please route to Joylene John, RN and Joella Prince, RN

## 2020-09-27 NOTE — Telephone Encounter (Signed)
  Follow up Call-  Call back number 09/25/2020  Post procedure Call Back phone  # [phone]  Permission to leave phone message Yes  Some recent data might be hidden     1st follow up call made.  NALM

## 2020-10-02 ENCOUNTER — Encounter: Payer: Self-pay | Admitting: Gastroenterology

## 2020-10-02 DIAGNOSIS — Z20828 Contact with and (suspected) exposure to other viral communicable diseases: Secondary | ICD-10-CM | POA: Diagnosis not present

## 2020-10-12 DIAGNOSIS — R059 Cough, unspecified: Secondary | ICD-10-CM | POA: Diagnosis not present

## 2020-12-18 DIAGNOSIS — M170 Bilateral primary osteoarthritis of knee: Secondary | ICD-10-CM | POA: Diagnosis not present

## 2021-03-04 DIAGNOSIS — D225 Melanocytic nevi of trunk: Secondary | ICD-10-CM | POA: Diagnosis not present

## 2021-03-04 DIAGNOSIS — D485 Neoplasm of uncertain behavior of skin: Secondary | ICD-10-CM | POA: Diagnosis not present

## 2021-03-04 DIAGNOSIS — D1801 Hemangioma of skin and subcutaneous tissue: Secondary | ICD-10-CM | POA: Diagnosis not present

## 2021-03-04 DIAGNOSIS — L821 Other seborrheic keratosis: Secondary | ICD-10-CM | POA: Diagnosis not present

## 2021-03-05 ENCOUNTER — Ambulatory Visit (INDEPENDENT_AMBULATORY_CARE_PROVIDER_SITE_OTHER): Payer: BC Managed Care – PPO | Admitting: Allergy and Immunology

## 2021-03-05 ENCOUNTER — Other Ambulatory Visit: Payer: Self-pay

## 2021-03-05 VITALS — BP 102/58 | HR 88 | Temp 98.9°F | Resp 16 | Ht 75.0 in | Wt 221.0 lb

## 2021-03-05 DIAGNOSIS — J3089 Other allergic rhinitis: Secondary | ICD-10-CM

## 2021-03-05 DIAGNOSIS — T7800XA Anaphylactic reaction due to unspecified food, initial encounter: Secondary | ICD-10-CM | POA: Diagnosis not present

## 2021-03-05 DIAGNOSIS — J301 Allergic rhinitis due to pollen: Secondary | ICD-10-CM | POA: Diagnosis not present

## 2021-03-05 MED ORDER — FEXOFENADINE HCL 180 MG PO TABS: 180.0000 mg | ORAL_TABLET | Freq: Every day | ORAL | 5 refills | Status: DC

## 2021-03-05 MED ORDER — TRIAMCINOLONE ACETONIDE 55 MCG/ACT NA AERO: 2.0000 | INHALATION_SPRAY | Freq: Every day | NASAL | 1 refills | Status: AC

## 2021-03-05 MED ORDER — MONTELUKAST SODIUM 10 MG PO TABS: 10.0000 mg | ORAL_TABLET | Freq: Every day | ORAL | 5 refills | Status: DC

## 2021-03-05 MED ORDER — OMEPRAZOLE 20 MG PO CPDR: 1.0000 | DELAYED_RELEASE_CAPSULE | Freq: Two times a day (BID) | ORAL | 5 refills | Status: AC

## 2021-03-05 NOTE — Progress Notes (Signed)
[facility] [place]   Follow-up Note  Referring Provider: Shon Baton, MD Primary Provider: Shon Baton, MD Date of Office Visit: 03/05/2021  Subjective:   Barry Daniels (DOB: [birth year]) is a 50 y.o. 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  His last visit to this clinic was 29 November 2019.  Overall he has done relatively well this past year while consistently using his Nasacort and montelukast and an antihistamine and immunotherapy.  Immunotherapy has been going well without any adverse effect.  He was able to go through this cedar pollination season without much difficulty this year.  He has developed a new issue.  He developed 10 days of urticaria in July 2021 at a point in time in which he was eating lots of nutts including cashews and almonds and peanuts.  By the process of elimination he was able to reconsume peanuts and almonds but he has remained away from cashew consumption.  As well, when he has eaten other nuts such as pecans he has developed some mouth tingling and possibly some skin itching for which he immediately took Benadryl and Allegra.  In addition, his sister was diagnosed with adenocarcinoma of the lung and apparently there is a very strong cancer history across the family.  His sister apparently was tested for p53 mutation which may have been present.  Allergies as of 03/05/2021       Reactions   Cashew Nut (anacardium Occidentale) Skin Test Hives   Other Hives   Possible Cashews   Statins Other (See Comments)   Drug intolerance        Medication List      AMBULATORY NON FORMULARY MEDICATION Allergy injections  Once a month   cholecalciferol 25 MCG (1000 UNIT) tablet Commonly known as: VITAMIN D Take 1,000 Units by mouth daily.   EPINEPHrine 0.3 mg/0.3  mL Soaj injection Commonly known as: EPI-PEN Inject 0.3 mg into the muscle as needed for anaphylaxis.   fexofenadine 180 MG tablet Commonly known as: ALLEGRA Take 180 mg by mouth daily.   metroNIDAZOLE 0.75 % cream Commonly known as: METROCREAM metronidazole 0.75 % topical cream   montelukast 10 MG tablet Commonly known as: SINGULAIR Take 10 mg by mouth at bedtime.   nystatin ointment Commonly known as: MYCOSTATIN Apply topically 3 (three) times daily as needed.   omeprazole 20 MG capsule Commonly known as: PRILOSEC Take 1 capsule by mouth 2 (two) times daily before a meal.   Repatha SureClick 032 MG/ML Soaj Generic drug: Evolocumab Inject into the skin every 14 (fourteen) days.   triamcinolone 55 MCG/ACT Aero nasal inhaler Commonly known as: NASACORT Place 2 sprays into the nose daily.   TURMERIC PO Take 1 tablet by mouth daily.        Past Medical History:  Diagnosis Date   Allergic rhinitis    Allergy    GERD (gastroesophageal reflux disease)    Hypercholesteremia    Rosacea     Past Surgical History:  Procedure Laterality Date   COLONOSCOPY     FINGER SURGERY Left    NASAL SINUS SURGERY     TONSILLECTOMY  2006   UPPER GASTROINTESTINAL ENDOSCOPY      Review of systems negative except as noted in HPI / PMHx or noted below:  Review of Systems  Constitutional:  Negative.   HENT: Negative.    Eyes: Negative.   Respiratory: Negative.    Cardiovascular: Negative.   Gastrointestinal: Negative.   Genitourinary: Negative.   Musculoskeletal: Negative.   Skin: Negative.   Neurological: Negative.   Endo/Heme/Allergies: Negative.   Psychiatric/Behavioral: Negative.      Objective:   Vitals:   03/05/21 1554  BP: (!) 102/58  Pulse: 88  Resp: 16  Temp: 98.9 F (37.2 C)  SpO2: 98%   Height: '6\' 3"'  (190.5 cm)  Weight: 221 lb (100.2 kg)   Physical Exam Constitutional:      Appearance: He is not diaphoretic.  HENT:     Head: Normocephalic.      Right Ear: Tympanic membrane, ear canal and external ear normal.     Left Ear: Tympanic membrane, ear canal and external ear normal.     Nose: Nose normal. No mucosal edema or rhinorrhea.     Mouth/Throat:     Pharynx: Uvula midline. No oropharyngeal exudate.  Eyes:     Conjunctiva/sclera: Conjunctivae normal.  Neck:     Thyroid: No thyromegaly.     Trachea: Trachea normal. No tracheal tenderness or tracheal deviation.  Cardiovascular:     Rate and Rhythm: Normal rate and regular rhythm.     Heart sounds: Normal heart sounds, S1 normal and S2 normal. No murmur heard. Pulmonary:     Effort: No respiratory distress.     Breath sounds: Normal breath sounds. No stridor. No wheezing or rales.  Lymphadenopathy:     Head:     Right side of head: No tonsillar adenopathy.     Left side of head: No tonsillar adenopathy.     Cervical: No cervical adenopathy.  Skin:    Findings: No erythema or rash.     Nails: There is no clubbing.  Neurological:     Mental Status: He is alert.    Diagnostics: none  Assessment and Plan:   1. Allergy with anaphylaxis due to food   2. Perennial allergic rhinitis   3. Seasonal allergic rhinitis due to pollen     1.  Continue immunotherapy, Nasacort, montelukast, Allegra  2.  Epi-pen, benadryl, MD/ER evaluation for allergic recation  3.  Nut panel w/reflex  4.  Genetic testing for familial cancer risk   5.  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  We will work through his issue of possible food allergy with the blood test noted above.  As well, I have encouraged him to undergo genetic testing for his strong familial history of cancer including examination of his p53 gene to exclude Li-Fraumeni syndrome.  I will see him back in this clinic in 1 year or earlier if there is a problem.  Allena Katz,  MD Allergy / Immunology [place]

## 2021-03-05 NOTE — Patient Instructions (Addendum)
  1.  Continue immunotherapy, Nasacort, montelukast, Allegra  2.  Epi-pen, benadryl, MD/ER evaluation for allergic recation  3.  Nut panel w/reflex  4.  Genetic testing for familial cancer risk   5.  Return to clinic in 1 year or earlier if problem

## 2021-03-06 ENCOUNTER — Encounter: Payer: Self-pay | Admitting: Allergy and Immunology

## 2021-03-11 LAB — PANEL 604726
Cor A 1 IgE: <0.1 kU/L
Cor A 14 IgE: <0.1 kU/L
Cor A 8 IgE: <0.1 kU/L
Cor A 9 IgE: <0.1 kU/L

## 2021-03-11 LAB — IGE NUT PROF. W/COMPONENT RFLX
F017-IgE Hazelnut (Filbert): 0.5 kU/L — AB
F018-IgE Brazil Nut: 0.28 kU/L — AB
F020-IgE Almond: 0.6 kU/L — AB
F202-IgE Cashew Nut: <0.1 kU/L
F203-IgE Pistachio Nut: 0.9 kU/L — AB
F256-IgE Walnut: 0.42 kU/L — AB
Macadamia Nut, IgE: 0.75 kU/L — AB
Peanut, IgE: 1.15 kU/L — AB
Pecan Nut IgE: <0.1 kU/L

## 2021-03-11 LAB — PANEL 604350: Ber E 1 IgE: <0.1 kU/L

## 2021-03-11 LAB — PANEL 604721
Jug R 1 IgE: <0.1 kU/L
Jug R 3 IgE: <0.1 kU/L

## 2021-03-11 LAB — PEANUT COMPONENTS
F352-IgE Ara h 8: <0.1 kU/L
F422-IgE Ara h 1: <0.1 kU/L
F423-IgE Ara h 2: <0.1 kU/L
F424-IgE Ara h 3: <0.1 kU/L
F427-IgE Ara h 9: <0.1 kU/L
F447-IgE Ara h 6: <0.1 kU/L

## 2021-03-11 LAB — ALLERGEN COMPONENT COMMENTS

## 2021-03-26 IMAGING — CT CT HEART SCORING
3 series · 14 of 20 positions shown, 16 images · non-contrast
Comparison: None.

CLINICAL DATA: Hyperlipidemia

EXAM:
CT HEART FOR CALCIUM SCORING
TECHNIQUE: CT heart was performed using prospective ECG gating.
A non-contrast exam for calcium scoring was performed.
Note that this exam targets the heart and the chest was not imaged
in its entirety.

[Series 2: calcium scoring 2.00 qr36 bestdiast 70% · axial · 0.41mm/px · z∈[+1782,+1878]mm · 5 of 73 slices shown, 7 images]
[im 13/73  vessel]
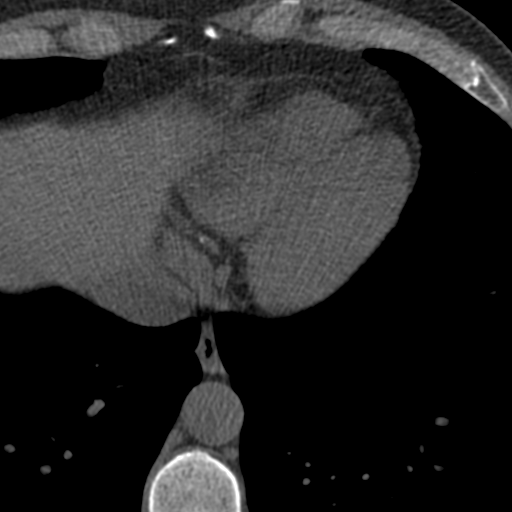
[im 13/73  lung]
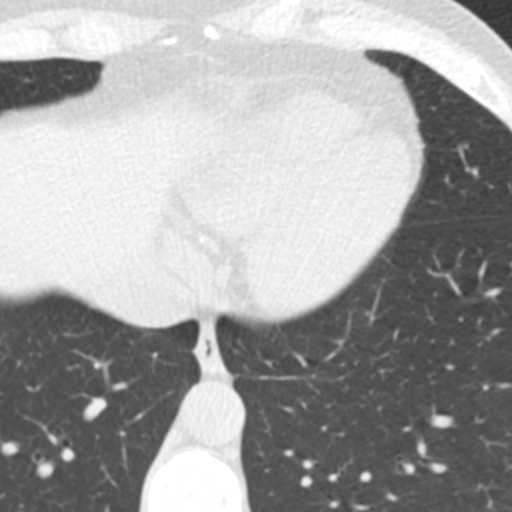
[im 25/73  vessel]
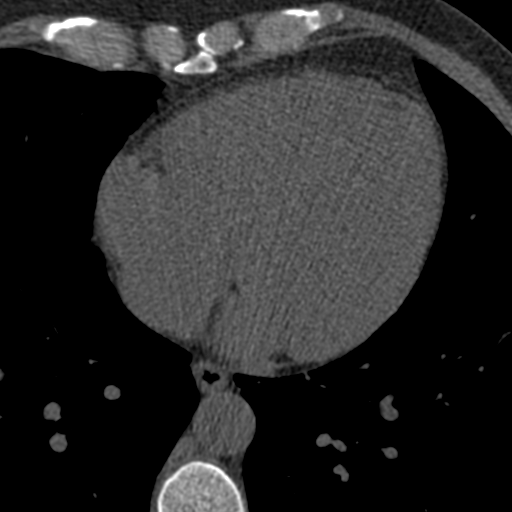
[im 37/73  vessel]
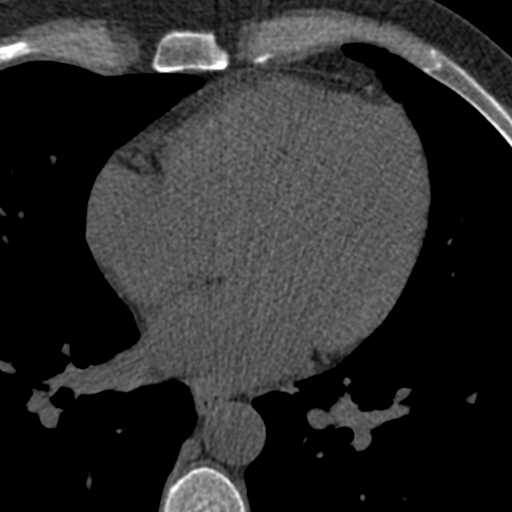
[im 49/73  vessel]
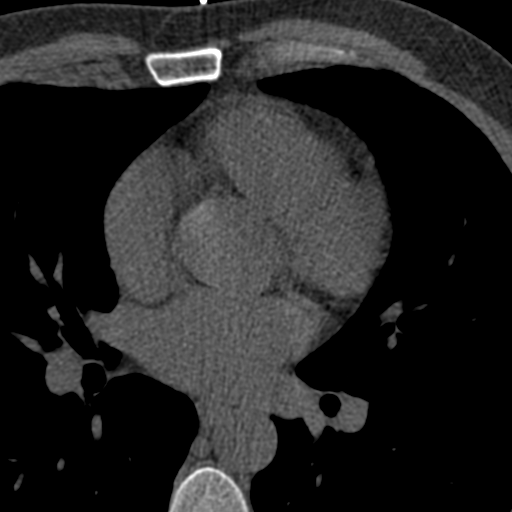
[im 61/73  vessel]
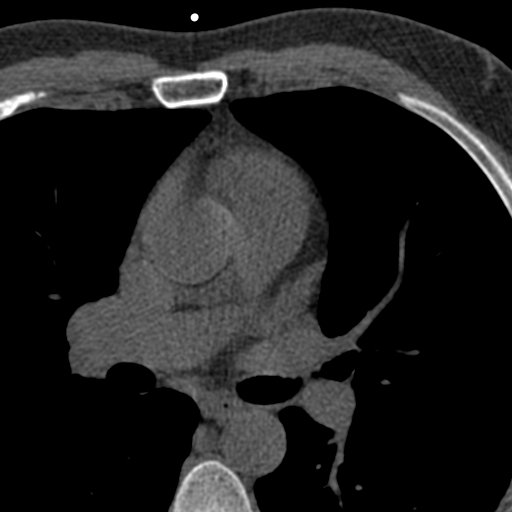
[im 61/73  lung]
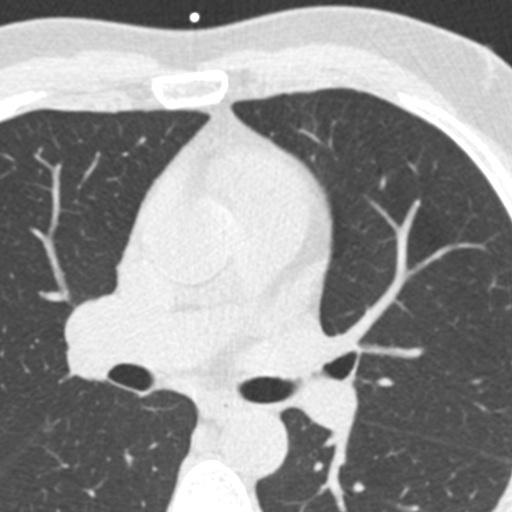

[Series 3: calcium scoring 2.00 br40 bestdiast 70% fov · axial · 0.66mm/px · z∈[+1770,+1874]mm · 5 of 80 slices shown]
[im 14/80  vessel]
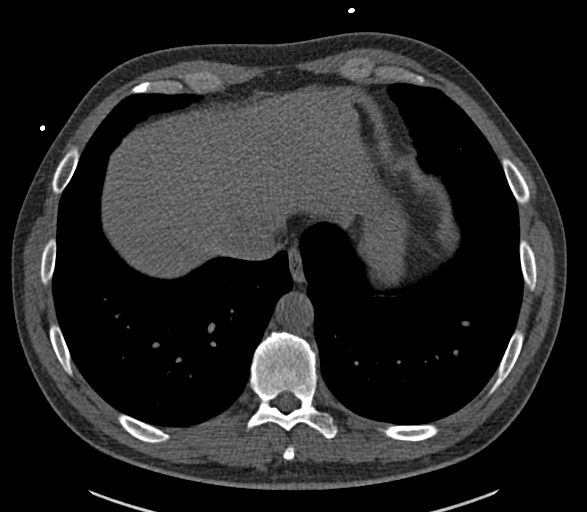
[im 27/80  vessel]
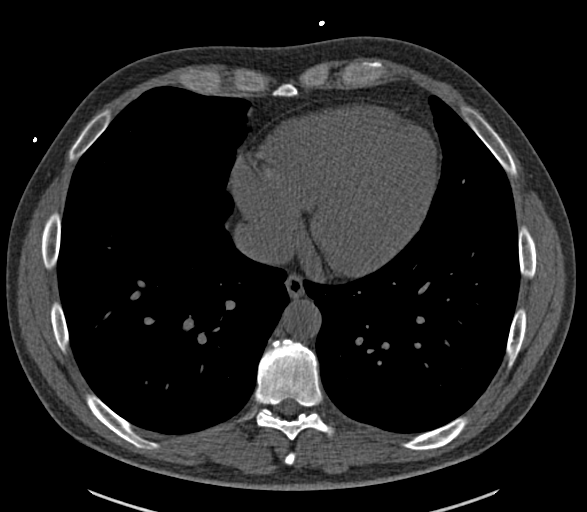
[im 40/80  vessel]
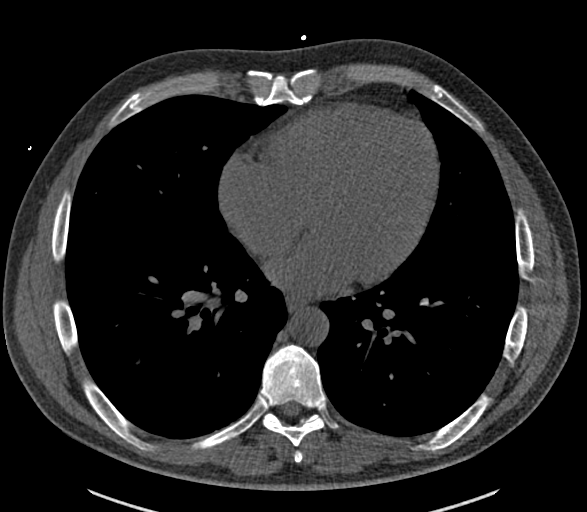
[im 53/80  vessel]
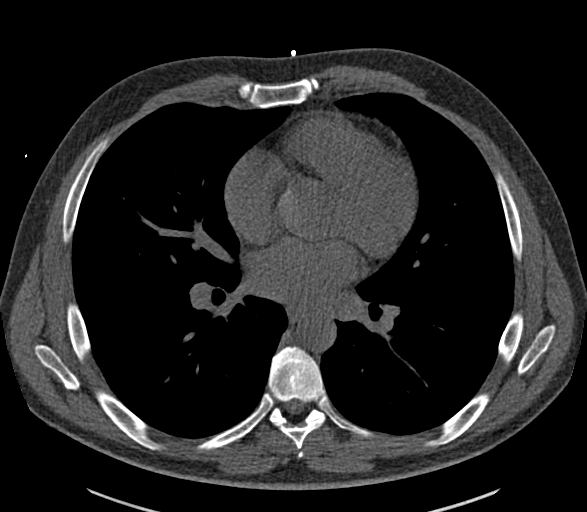
[im 66/80  vessel]
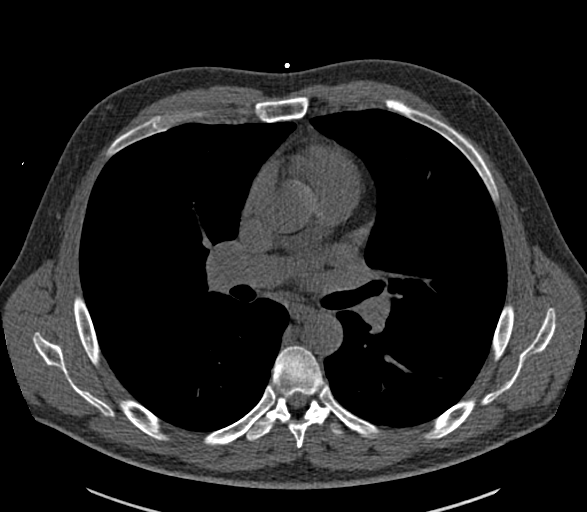

[Series 9: calcium scoring 2.00 br60 bestdiast 70% fov · axial · 0.64mm/px · z∈[+1786,+1872]mm · 4 of 73 slices shown]
[im 15/73  vessel]
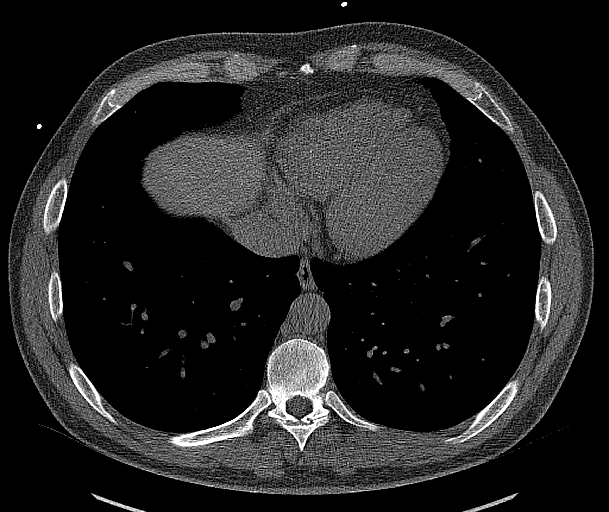
[im 29/73  vessel]
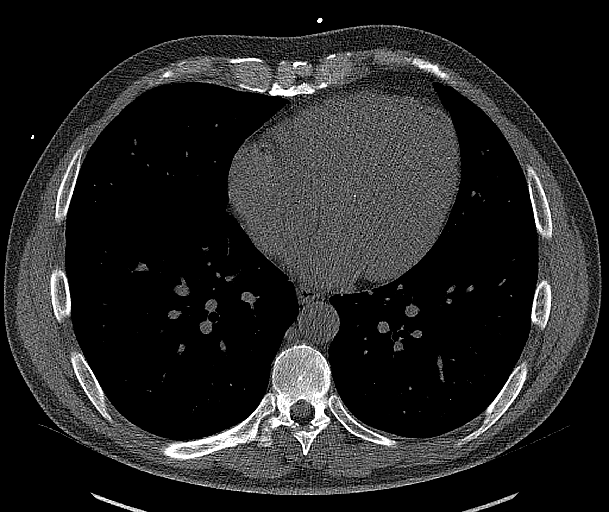
[im 44/73  vessel]
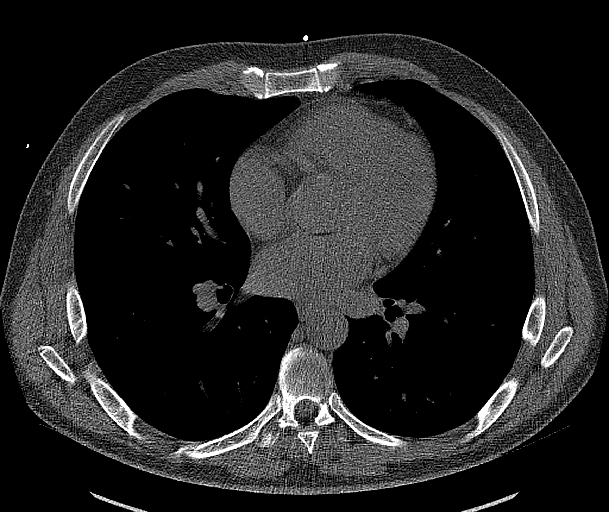
[im 58/73  vessel]
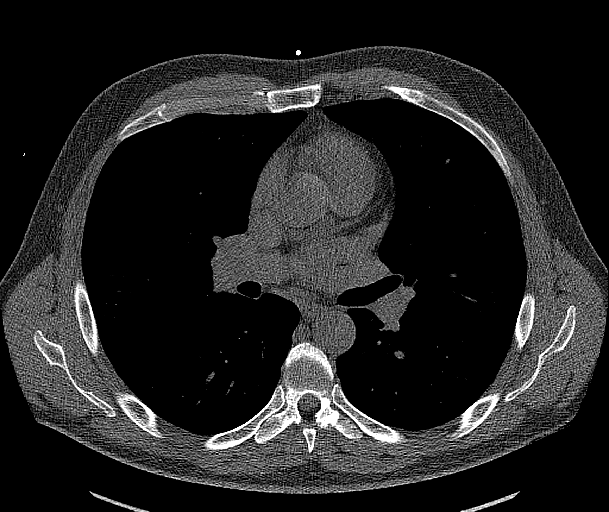

[14 of 20 positions shown; findings below may reference images not displayed]

FINDINGS: Technical quality: Good.

CORONARY CALCIUM

Total Agatston Score: 1.83 with punctate calcification in the mid to
distal right coronary artery

[HOSPITAL] percentile:  67

OTHER FINDINGS:

Cardiovascular: Heart is normal size. Visualized aorta normal
caliber with maximum diameter in the ascending thoracic aorta
measuring 3.4 cm.

Mediastinum/Nodes: No adenopathy in the lower mediastinum or hila.

Lungs/Pleura: Visualized lungs clear.  No effusions.

Upper Abdomen: Imaging into the upper abdomen shows no acute
findings.

Musculoskeletal: Chest wall soft tissues are unremarkable. No acute
bony abnormality.
IMPRESSION: The observed calcium score of 1.83 is at the percentile 67 for
subjects of the same age, gender and race/ethnicity who are free of
clinical cardiovascular disease and treated diabetes.

No acute or significant extracardiac abnormality

## 2021-04-01 ENCOUNTER — Telehealth: Payer: Self-pay | Admitting: *Deleted

## 2021-04-01 NOTE — Telephone Encounter (Signed)
Patient called and stated that he needs to pick up his next set of Red vials and receive his first injection. 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 Patient will bring in his injection records with him when he comes in. Patient has been scheduled to come in. Patient verbalized understanding.

## 2021-04-02 DIAGNOSIS — J301 Allergic rhinitis due to pollen: Secondary | ICD-10-CM | POA: Diagnosis not present

## 2021-04-02 NOTE — Progress Notes (Signed)
VIALS MADE. EXP 04-02-22

## 2021-05-02 ENCOUNTER — Other Ambulatory Visit: Payer: Self-pay

## 2021-05-02 ENCOUNTER — Ambulatory Visit (INDEPENDENT_AMBULATORY_CARE_PROVIDER_SITE_OTHER): Payer: BC Managed Care – PPO | Admitting: *Deleted

## 2021-05-02 DIAGNOSIS — J309 Allergic rhinitis, unspecified: Secondary | ICD-10-CM

## 2021-05-02 NOTE — Progress Notes (Signed)
Immunotherapy   Patient Details  Name: Barry Daniels MRN: [id number] Date of Birth: Oct 16, 1970  05/02/2021  Barry Daniels here to pick up  Trees, Barry Daniels Following schedule: C  Frequency: Weekly @.50 Every 4 Weeks  Epi-Pen:Epi-Pen Available  Consent signed and patient instructions given.  Patient received .68m of Tree in the RUA and .159mof Barry Daniels in the LUKittitasPatient waited in office without issues and took vials with him. 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 KoNeldon McPrevious records have been labeled and placed in bulk scanning.    Barry Daniels 05/02/2021, 10:05 AM

## 2021-06-05 DIAGNOSIS — Z23 Encounter for immunization: Secondary | ICD-10-CM | POA: Diagnosis not present

## 2021-11-05 DIAGNOSIS — M170 Bilateral primary osteoarthritis of knee: Secondary | ICD-10-CM | POA: Diagnosis not present

## 2021-11-21 ENCOUNTER — Other Ambulatory Visit: Payer: Self-pay

## 2021-11-21 ENCOUNTER — Inpatient Hospital Stay: Payer: BC Managed Care – PPO

## 2021-11-21 ENCOUNTER — Encounter: Payer: Self-pay | Admitting: Genetic Counselor

## 2021-11-21 ENCOUNTER — Inpatient Hospital Stay: Payer: BC Managed Care – PPO | Attending: Genetic Counselor | Admitting: Genetic Counselor

## 2021-11-21 DIAGNOSIS — Z801 Family history of malignant neoplasm of trachea, bronchus and lung: Secondary | ICD-10-CM | POA: Diagnosis not present

## 2021-11-21 DIAGNOSIS — Z800 Family history of malignant neoplasm of digestive organs: Secondary | ICD-10-CM | POA: Insufficient documentation

## 2021-11-21 NOTE — Progress Notes (Signed)
REFERRING PROVIDER: Shon Baton, MD [address] [place],  [place] [address]  PRIMARY PROVIDER:  Shon Baton, MD  PRIMARY REASON FOR VISIT:  1. Family history of lung cancer   2. 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  [name] is concerned about his family history of lung and pancreatic cancer.  Family members with lung cancer do have a history of smoking.  CANCER HISTORY:  Oncology History   No history exists.    Past Medical History:  Diagnosis Date   Allergic rhinitis    Allergy    Family history of lung cancer    Family history of pancreatic cancer    GERD (gastroesophageal reflux disease)    Hypercholesteremia    Rosacea     Past Surgical History:  Procedure Laterality Date   COLONOSCOPY     FINGER SURGERY Left    NASAL SINUS SURGERY     TONSILLECTOMY  2006   UPPER GASTROINTESTINAL ENDOSCOPY      Social History   Socioeconomic History   Marital status: Married    Spouse name: Not on file   Number of children: 2   Years of education: Not on file   Highest education level: Not on file  Occupational History   Occupation: Physician  Tobacco Use   Smoking status: Never   Smokeless tobacco: Never  Vaping Use   Vaping Use: Never used  Substance and Sexual Activity   Alcohol use: Yes    Comment: 2 per day   Drug use: Never   Sexual activity: Not on file  Other Topics Concern   Not on file  Social History Narrative   Not on file   Social Determinants of Health   Financial  Resource Strain: Not on file  Food Insecurity: Not on file  Transportation Needs: Not on file  Physical Activity: Not on file  Stress: Not on file  Social Connections: Not on file     FAMILY HISTORY:  We obtained a detailed, 4-generation family history.  Significant diagnoses are listed below: Family History  Problem Relation Age of Onset   Allergic rhinitis Mother    Hyperlipidemia Mother    Depression Mother    Liver cancer Father 31       bile duct   Hypertension Father    Hyperlipidemia Father    Pancreatic cancer Father        d. 42   Allergic rhinitis Sister    Lung cancer Sister 29   Lung cancer Maternal Aunt 72   Lung cancer Maternal Aunt 39   Lung cancer Maternal Uncle 51   Heart attack Paternal Uncle    COPD Maternal Grandmother    Dementia Maternal Grandmother    Lung cancer Maternal Grandfather 55   Heart attack Paternal Grandfather    Colon cancer Neg Hx    Esophageal cancer Neg Hx    Rectal cancer Neg Hx    Stomach cancer Neg Hx     The  patient has two daughters who are cancer free.  He has one sister who died of lung cancer at age 32. Prior to her death she was very healthy and had quit smoking 20 years prior.  His mother is living and his father is deceased.  The patient's mother is 61 and does not have cancer.  She has three brothers and three sisters.  One sister has lung cancer and is a former smoker.  A brother died at 1 from lung cancer and a sister died at 68 from lung cancer.  Both had been smokers.  The maternal grandparents are deceased.  The grandfather had lung cancer at 57 and the grandmother died of COPD complicated by dementia.    The patient's father was a English as a second language teacher and developed bile duct cancer at 30.  He has a brother and sister who are cancer free.  His parents are deceased from non-cancer related issues.  [name] is unaware of previous family history of genetic testing for hereditary cancer risks. Patient's maternal ancestors are of  [place] and [place] descent, and paternal ancestors are of [place], [place] and [place] descent. There is no reported Ashkenazi Jewish ancestry. There is no known consanguinity.  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 We, therefore, discussed and recommended the following at today's visit.   DISCUSSION: We discussed that, in general, most cancer is not inherited in families, but instead is sporadic or familial. Sporadic cancers occur by chance and typically happen at older ages (>50 years) as this type of cancer is caused by genetic changes acquired during an individuals lifetime. Some families have more cancers than would be expected by chance; however, the ages or types of cancer are not consistent with a known genetic mutation or known genetic mutations have been ruled out. This type of familial cancer is thought to be due to a combination of multiple genetic, environmental, hormonal, and lifestyle factors. While this combination of factors likely increases the risk of cancer, the exact source of this risk is not currently identifiable or testable.  [name] was concerned for Barry Daniels syndrome [facility]) being the cause of the lung cancer in his family.  We discussed that LFS is a rare hereditary condition that is characterized by young soft tissue cancers, most commonly breast cancer in women, brain tumors and sarcomas.  Lung cancer can also be part of this syndrome. There are no other cancers in the family that would suggest LFS as the cause of cancer in the family.  However, in rare circumstances, hereditary mutations in EGFR can cause hereditary lung cancer syndromes.  EGFR mutations are most commonly seen in lung tumors, but one mutation EGFR  T790M is associated with hereditary lung cancer.  The EGFR T790M mutation is thought to be found in about 1% of NSCLC, with the median age of 12 at diagnosis.  The most common histology is adenocarcinoma, which is the type of histology [name] states most of his family members had.  We also discussed that up to 15% of pancreatic cancer is hereditary, with most cases associated with BRCA mutations.  There are other genes that can be associated with hereditary pancreatic cancer syndromes.  These include PALB2, ATM and the Lynch syndrome genes.  We  discussed that testing is beneficial for several reasons including knowing how to follow individuals ,and understand if other family members could be at risk for cancer and allow them to undergo genetic testing.   We discussed that some people do not want to undergo genetic testing due to fear of genetic discrimination.  A federal law called the Genetic Information Non-Discrimination Act (GINA) of 2008 helps protect individuals against genetic discrimination based on their genetic test results.  It impacts both health insurance and employment.  With health insurance, it protects against increased premiums, being kicked off insurance or being forced to take a test in order to be insured.  For employment it protects against hiring, firing and promoting decisions based on genetic test results.  Health status due to a cancer diagnosis is not protected under GINA.   We reviewed the characteristics, features and inheritance patterns of hereditary cancer syndromes. We also discussed genetic testing, including the appropriate family members to test, the process of testing, insurance coverage and turn-around-time for results. We discussed the implications of a negative, positive, carrier and/or variant of uncertain significant result. We recommended [name] pursue genetic testing for the CancerNext-Expanded+RNAinsight gene panel.   The CancerNext-Expanded gene panel  offered by [facility] and includes sequencing and rearrangement analysis for the following 77 genes: AIP, ALK, APC*, ATM*, AXIN2, BAP1, BARD1, BLM, BMPR1A, BRCA1*, BRCA2*, BRIP1*, CDC73, CDH1*, CDK4, CDKN1B, CDKN2A, CHEK2*, CTNNA1, DICER1, FANCC, FH, FLCN, GALNT12, KIF1B, LZTR1, MAX, MEN1, MET, MLH1*, MSH2*, MSH3, MSH6*, MUTYH*, NBN, NF1*, NF2, NTHL1, PALB2*, PHOX2B, PMS2*, POT1, PRKAR1A, PTCH1, PTEN*, RAD51C*, RAD51D*, RB1, RECQL, RET, SDHA, SDHAF2, SDHB, SDHC, SDHD, SMAD4, SMARCA4, SMARCB1, SMARCE1, STK11, SUFU, TMEM127, TP53*, TSC1, TSC2, VHL and XRCC2 (sequencing and deletion/duplication); EGFR, EGLN1, HOXB13, KIT, MITF, PDGFRA, POLD1, and POLE (sequencing only); EPCAM and GREM1 (deletion/duplication only). DNA and RNA analyses performed for * genes.   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 Despite that he meets criteria, he may still have an out of pocket cost. We discussed that if his out of pocket cost for testing is over $100, the laboratory will call and confirm whether he wants to proceed with testing.  If the out of pocket cost of testing is less than $100 he will be billed by the genetic testing laboratory.   PLAN: Despite our recommendation, [name] did not wish to pursue genetic testing at today's visit. We understand this decision and remain available to coordinate genetic testing at any time in the future. We, therefore, recommend [name] continue to follow the cancer screening guidelines given by his primary healthcare provider.  Based on Dr. Silvestre Mesi family history, we recommended his mother, who is the common relative between his sister with lung cancer and his aunts/uncles/grandfather with lung cancer, have genetic counseling and testing. [name] will let us know if we can be of any assistance in coordinating genetic counseling and/or testing for this family member.   Lastly, we encouraged [name] to remain in contact  with cancer genetics annually so that we can continuously update the family history and inform him of any changes in cancer genetics and testing that may be of benefit for this family.   Dr. Silvestre Mesi questions were answered to his satisfaction today. Our contact information was provided should additional questions or concerns arise. Thank you for the referral and allowing [place] to share in the care of your patient.   Karen P. Florene Glen, [place], [facility] Licensed, Certified  Genetic Counselor [email or web address] phone: [phone]  The patient was seen for a total of 45 minutes in face-to-face genetic counseling.  The patient was seen alone.  This patient was discussed with Drs. Magrinat, Lindi Adie and/or Burr Medico who agrees with the above.    _______________________________________________________________________ For Office Staff:  Number of people involved in session: 1 Was an Intern/ student involved with case: no

## 2021-12-11 ENCOUNTER — Other Ambulatory Visit: Payer: Self-pay | Admitting: Genetic Counselor

## 2021-12-11 DIAGNOSIS — Z800 Family history of malignant neoplasm of digestive organs: Secondary | ICD-10-CM

## 2021-12-12 ENCOUNTER — Other Ambulatory Visit: Payer: Self-pay

## 2021-12-12 ENCOUNTER — Other Ambulatory Visit: Payer: BC Managed Care – PPO

## 2021-12-12 ENCOUNTER — Inpatient Hospital Stay: Payer: BC Managed Care – PPO

## 2021-12-12 DIAGNOSIS — Z800 Family history of malignant neoplasm of digestive organs: Secondary | ICD-10-CM

## 2021-12-12 DIAGNOSIS — Z801 Family history of malignant neoplasm of trachea, bronchus and lung: Secondary | ICD-10-CM | POA: Diagnosis not present

## 2021-12-12 LAB — GENETIC SCREENING ORDER

## 2021-12-16 DIAGNOSIS — Z23 Encounter for immunization: Secondary | ICD-10-CM | POA: Diagnosis not present

## 2021-12-31 ENCOUNTER — Ambulatory Visit: Payer: Self-pay | Admitting: Genetic Counselor

## 2021-12-31 ENCOUNTER — Encounter: Payer: Self-pay | Admitting: Genetic Counselor

## 2021-12-31 ENCOUNTER — Telehealth: Payer: Self-pay | Admitting: Genetic Counselor

## 2021-12-31 DIAGNOSIS — Z1379 Encounter for other screening for genetic and chromosomal anomalies: Secondary | ICD-10-CM

## 2021-12-31 DIAGNOSIS — Z801 Family history of malignant neoplasm of trachea, bronchus and lung: Secondary | ICD-10-CM

## 2021-12-31 DIAGNOSIS — Z800 Family history of malignant neoplasm of digestive organs: Secondary | ICD-10-CM

## 2021-12-31 NOTE — Telephone Encounter (Signed)
Revealed negative genetic testing.  Discussed that we do not know why there is cancer in the family. It could be due to a different gene that we are not testing, or maybe our current technology may not be able to pick something up.  It will be important for him to keep in contact with genetics to keep up with whether additional testing may be needed.  ? ? ?

## 2021-12-31 NOTE — Progress Notes (Signed)
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 Please refer to our prior cancer genetics clinic note for more information regarding our discussion, assessment and recommendations, at the time. Dr. Silvestre Mesi recent genetic test results were disclosed to him, as were recommendations warranted by these results. These results and recommendations are discussed in more detail below. ? ?CANCER HISTORY:  ?Oncology History  ? No history exists.  ? ? ?FAMILY HISTORY:  ?We obtained a detailed, 4-generation family history.  Significant diagnoses are listed below: ?Family History  ?Problem Relation Age of Onset  ? Allergic rhinitis Mother   ? Hyperlipidemia Mother   ? Depression Mother   ? Liver cancer Father 20  ?     bile duct  ? Hypertension Father   ? Hyperlipidemia Father   ? Pancreatic cancer Father   ?     d. 65  ? Allergic rhinitis Sister   ? Lung cancer Sister 42  ? Lung cancer Maternal Aunt 72  ? Lung cancer Maternal Aunt 39  ? Lung cancer Maternal Uncle 72  ? Heart attack Paternal Uncle   ? COPD Maternal Grandmother   ? Dementia Maternal Grandmother   ? Lung cancer Maternal Grandfather 40  ? Heart attack Paternal Grandfather   ? Colon cancer Neg Hx   ? Esophageal cancer Neg Hx   ? Rectal cancer Neg Hx   ? Stomach cancer Neg Hx   ? ? ?The patient has two daughters who are cancer free.  He has one sister who died of lung cancer at age 76. Prior to her death she was very healthy and had quit smoking 20 years prior.  His mother is living and his father is deceased. ?  ?The patient's mother is 60 and does not have cancer.  She has three brothers and three sisters.  One sister has lung cancer and is a former smoker.  A brother died at 47 from lung cancer and a sister died at 50 from lung cancer.  Both had been smokers.  The maternal grandparents are deceased.  The grandfather had lung cancer at 43  and the grandmother died of COPD complicated by dementia.   ?  ?The patient's father was a English as a second language teacher and developed bile duct cancer at 75.  He has a brother and sister who are cancer free.  His parents are deceased from non-cancer related issues. ?  ?Dr. Joylene Draft is unaware of previous family history of genetic testing for hereditary cancer risks. Patient's maternal ancestors are of [place] and [place] descent, and paternal ancestors are of [place], [place] and [place] descent. There is no reported Ashkenazi Jewish ancestry. There is no known consanguinity. ? ?GENETIC TEST RESULTS: Genetic testing reported out on December 30, 2021 through the CancerNext-Expanded+RNAinsight cancer panel found no pathogenic mutations. The CancerNext-Expanded gene panel offered by [facility] and includes sequencing and rearrangement analysis for the following 77 genes: AIP, ALK, APC*, ATM*, AXIN2, BAP1, BARD1, BLM, BMPR1A, BRCA1*, BRCA2*, BRIP1*, CDC73, CDH1*, CDK4, CDKN1B, CDKN2A, CHEK2*, CTNNA1, DICER1, FANCC, FH, FLCN, GALNT12, KIF1B, LZTR1, MAX, MEN1, MET, MLH1*, MSH2*, MSH3, MSH6*, MUTYH*, NBN, NF1*, NF2, NTHL1, PALB2*, PHOX2B, PMS2*, POT1, PRKAR1A, PTCH1, PTEN*, RAD51C*, RAD51D*, RB1, RECQL, RET, SDHA, SDHAF2, SDHB, SDHC, SDHD, SMAD4, SMARCA4, SMARCB1, SMARCE1, STK11, SUFU, TMEM127, TP53*, TSC1, TSC2, VHL and XRCC2 (sequencing and deletion/duplication); EGFR, EGLN1, HOXB13, KIT, MITF, PDGFRA, POLD1, and POLE (sequencing only); EPCAM  and GREM1 (deletion/duplication only). DNA and RNA analyses performed for * genes. The test report has been scanned into EPIC and is located under the Molecular Pathology section of the Results Review tab.  A portion of the result report is included below for reference.  ? ? ? ?We discussed with Dr. Joylene Draft that because current genetic testing is not perfect, it is possible there may be a gene mutation in one of these genes that current testing cannot detect, but that chance is small.  We also  discussed, that there could be another gene that has not yet been discovered, or that we have not yet tested, that is responsible for the cancer diagnoses in the family. It is also possible there is a hereditary cause for the cancer in the family that Dr. Joylene Draft did not inherit and therefore was not identified in his testing.  Therefore, it is important to remain in touch with cancer genetics in the future so that we can continue to offer Dr. Joylene Draft the most up to date genetic testing.  ? ?ADDITIONAL GENETIC TESTING: We discussed with Dr. Joylene Draft that his genetic testing was fairly extensive.  If there are genes identified to increase cancer risk that can be analyzed in the future, we would be happy to discuss and coordinate this testing at that time.   ? ?CANCER SCREENING RECOMMENDATIONS: Dr. Silvestre Mesi test result is considered negative (normal).  This means that we have not identified a hereditary cause for his family history of cancer at this time. Most cancers happen by chance and this negative test suggests that his cancer may fall into this category.   ? ?While reassuring, this does not definitively rule out a hereditary predisposition to cancer. It is still possible that there could be genetic mutations that are undetectable by current technology. There could be genetic mutations in genes that have not been tested or identified to increase cancer risk.  Therefore, it is recommended he continue to follow the cancer management and screening guidelines provided by his primary healthcare provider.  ? ?An individual's cancer risk and medical management are not determined by genetic test results alone. Overall cancer risk assessment incorporates additional factors, including personal medical history, family history, and any available genetic information that may result in a personalized plan for cancer prevention and surveillance ? ?RECOMMENDATIONS FOR FAMILY MEMBERS:  Individuals in this family might be at some  increased risk of developing cancer, over the general population risk, simply due to the family history of cancer.  We recommended women in this family have a yearly mammogram beginning at age 74, or 49 years younger than the earliest onset of cancer, an annual clinical breast exam, and perform monthly breast self-exams. Women in this family should also have a gynecological exam as recommended by their primary provider. All family members should be referred for colonoscopy starting at age 34. ? ?It is also possible there is a hereditary cause for the cancer in Dr. Silvestre Mesi family that he did not inherit and therefore was not identified in him.  Based on Dr. Silvestre Mesi family history, we recommended his mother, have genetic counseling and testing. Dr. Joylene Draft will let us know if we can be of any assistance in coordinating genetic counseling and/or testing for this family member.  ? ?FOLLOW-UP: Lastly, we discussed with Dr. Joylene Draft that cancer genetics is a rapidly advancing field and it is possible that new genetic tests will be appropriate for him and/or his family members in the future. We encouraged  him to remain in contact with cancer genetics on an annual basis so we can update his personal and family histories and let him know of advances in cancer genetics that may benefit this family.  ? ?Our contact number was provided. Dr. Silvestre Mesi questions were answered to his satisfaction, and he knows he is welcome to call us at anytime with additional questions or concerns.  ? ?Roma Kayser, MS, [place] ?Licensed, Insurance risk surveyor ?[email or web address] ? ?

## 2022-01-13 DIAGNOSIS — J3089 Other allergic rhinitis: Secondary | ICD-10-CM | POA: Diagnosis not present

## 2022-01-22 NOTE — Progress Notes (Signed)
VIALS EXP 01-23-23 ?

## 2022-02-20 ENCOUNTER — Ambulatory Visit (INDEPENDENT_AMBULATORY_CARE_PROVIDER_SITE_OTHER): Payer: BC Managed Care – PPO

## 2022-02-20 DIAGNOSIS — J309 Allergic rhinitis, unspecified: Secondary | ICD-10-CM

## 2022-02-20 NOTE — Progress Notes (Signed)
Immunotherapy   Patient Details  Name: Barry Daniels MRN: [id number] Date of Birth: 20-Sep-1970  02/20/2022  [name] here to pick up  allergen vial of dust mites and trees.  Following schedule: C after increasing weekly at 0.1cc. Frequency:1 time per week then at [facility] every four weeks.  Epi-Pen:Epi-Pen Available  Consent signed and patient instructions given. Patient waited in the lobby for thirty minutes without an issue.    Julius Bowels 02/20/2022, 9:33 AM

## 2022-05-14 ENCOUNTER — Encounter: Payer: Self-pay | Admitting: Genetic Counselor

## 2022-06-11 DIAGNOSIS — Z23 Encounter for immunization: Secondary | ICD-10-CM | POA: Diagnosis not present

## 2022-07-16 ENCOUNTER — Other Ambulatory Visit: Payer: Self-pay | Admitting: Internal Medicine

## 2022-07-16 DIAGNOSIS — Z801 Family history of malignant neoplasm of trachea, bronchus and lung: Secondary | ICD-10-CM

## 2022-08-14 ENCOUNTER — Ambulatory Visit
Admission: RE | Admit: 2022-08-14 | Discharge: 2022-08-14 | Disposition: A | Discharge status: Home / Self Care | Payer: BC Managed Care – PPO | Source: Ambulatory Visit | Attending: Internal Medicine | Admitting: Internal Medicine

## 2022-08-14 DIAGNOSIS — Z801 Family history of malignant neoplasm of trachea, bronchus and lung: Secondary | ICD-10-CM

## 2022-08-14 DIAGNOSIS — R918 Other nonspecific abnormal finding of lung field: Secondary | ICD-10-CM | POA: Diagnosis not present

## 2022-08-14 DIAGNOSIS — R911 Solitary pulmonary nodule: Secondary | ICD-10-CM | POA: Diagnosis not present

## 2022-08-22 ENCOUNTER — Other Ambulatory Visit: Payer: BC Managed Care – PPO

## 2022-11-10 ENCOUNTER — Telehealth: Payer: Self-pay | Admitting: *Deleted

## 2022-11-10 NOTE — Telephone Encounter (Signed)
Patient called and stated that he needed new Red allergy vials to be ordered and scheduled so that he can come in and receive his first injection and pick up his Red vials to continue his injections at home. Patient has been scheduled and advised to bring his injection records with him. I did advise the patient that he was due for an office visit with Dr. Neldon Mc since he has not been seen since June of 2022. Patient has been scheduled for a follow up visit with Dr. Neldon Mc in the summer to best work with his schedule. Patient verbalized understanding.

## 2022-11-11 NOTE — Progress Notes (Signed)
VIALS EXP 11-12-23

## 2022-11-11 NOTE — Telephone Encounter (Signed)
With the newly implemented policy of patients needing to discontinue at home injections I just wanted to make sure and receive documentation if you were ok if the patient still continued to administer his injections at home? Thank You.

## 2022-11-12 DIAGNOSIS — J3089 Other allergic rhinitis: Secondary | ICD-10-CM | POA: Diagnosis not present

## 2022-12-11 ENCOUNTER — Ambulatory Visit (INDEPENDENT_AMBULATORY_CARE_PROVIDER_SITE_OTHER): Payer: BC Managed Care – PPO

## 2022-12-11 DIAGNOSIS — J309 Allergic rhinitis, unspecified: Secondary | ICD-10-CM

## 2022-12-11 NOTE — Progress Notes (Signed)
Immunotherapy   Patient Details  Name: Barry Daniels MRN: [id number] Date of Birth: 1970-10-05  12/11/2022  Barry Daniels here to pick up  dust mites, and trees.  Following schedule: C  Frequency:1 time per week Epi-Pen:Epi-Pen Available  Consent signed previously and patient instructions given. Patient received his first injection in office and then took his vials and paperwork with him to administer at home.    Julius Bowels 12/11/2022, 7:17 PM

## 2023-03-05 DIAGNOSIS — M25562 Pain in left knee: Secondary | ICD-10-CM | POA: Diagnosis not present

## 2023-03-05 DIAGNOSIS — M170 Bilateral primary osteoarthritis of knee: Secondary | ICD-10-CM | POA: Diagnosis not present

## 2023-03-05 DIAGNOSIS — G8929 Other chronic pain: Secondary | ICD-10-CM | POA: Diagnosis not present

## 2023-03-05 DIAGNOSIS — M25561 Pain in right knee: Secondary | ICD-10-CM | POA: Diagnosis not present

## 2023-03-10 ENCOUNTER — Other Ambulatory Visit: Payer: Self-pay

## 2023-03-10 ENCOUNTER — Encounter: Payer: Self-pay | Admitting: Allergy and Immunology

## 2023-03-10 ENCOUNTER — Ambulatory Visit (INDEPENDENT_AMBULATORY_CARE_PROVIDER_SITE_OTHER): Payer: BC Managed Care – PPO | Admitting: Allergy and Immunology

## 2023-03-10 VITALS — BP 126/76 | HR 75 | Temp 98.4°F | Resp 16 | Ht 75.5 in | Wt 225.8 lb

## 2023-03-10 DIAGNOSIS — J3089 Other allergic rhinitis: Secondary | ICD-10-CM | POA: Diagnosis not present

## 2023-03-10 DIAGNOSIS — T781XXD Other adverse food reactions, not elsewhere classified, subsequent encounter: Secondary | ICD-10-CM

## 2023-03-10 DIAGNOSIS — J301 Allergic rhinitis due to pollen: Secondary | ICD-10-CM | POA: Diagnosis not present

## 2023-03-10 MED ORDER — EPINEPHRINE 0.3 MG/0.3ML IJ SOAJ: 0.3000 mg | INTRAMUSCULAR | 1 refills | Status: AC | PRN

## 2023-03-10 MED ORDER — MONTELUKAST SODIUM 10 MG PO TABS
10.0000 mg | ORAL_TABLET | Freq: Every day | ORAL | 3 refills | Status: AC
Start: 2023-03-10 — End: ?

## 2023-03-10 MED ORDER — RYALTRIS 665-25 MCG/ACT NA SUSP: 2.0000 | Freq: Two times a day (BID) | NASAL | 5 refills | Status: DC

## 2023-03-10 MED ORDER — FEXOFENADINE HCL 180 MG PO TABS: 180.0000 mg | ORAL_TABLET | Freq: Every day | ORAL | 3 refills | Status: AC | PRN

## 2023-03-10 MED ORDER — RYALTRIS 665-25 MCG/ACT NA SUSP
2.0000 | Freq: Two times a day (BID) | NASAL | 5 refills | Status: DC
Start: 2023-03-10 — End: 2023-03-10

## 2023-03-10 NOTE — Patient Instructions (Addendum)
  1. Try sample of Ryaltris - 2 sprays each nostril 1-2 times per day (Nasacort)  2. Continue immunotherapy   3. Continue montelukast + Fexofenadine  4. Use Epi-pen if needed  5. Omalizumab ???  6. Return to clinic in 1 year or earlier if problem

## 2023-03-10 NOTE — Progress Notes (Unsigned)
[place] [facility] [place] [facility] [place] [facility] [place] [facility] [place]   Follow-up Note  Referring Provider: Creola Corn, MD Primary Provider: Creola Corn, MD Date of Office Visit: 03/10/2023  Subjective:   Barry Daniels (DOB: March 19, 1971) is a 52 y.o. male who [facility] in re-evaluation of the following:  HPI: Barry Daniels turns to this clinic in evaluation of allergic rhinitis and food hypersensitivity.  I last saw him in this clinic 05 March 2021.  He still continues to have some chronic rhinitis even in the face of using immunotherapy and a nasal steroid and an antihistamine and a leukotriene modifier.  He does not have a history of recurrent sinus infections but always has some nasal congestion and some drip.  He remains away from the consumption of all tree nuts at this point in time.  Allergies as of 03/10/2023       Reactions   Cashew Nut (anacardium Occidentale) Skin Test Hives   Other Hives   Possible Cashews   Statins Other (See Comments)   Drug intolerance        Medication List    AMBULATORY NON FORMULARY MEDICATION Allergy injections  Once a month   cholecalciferol 25 MCG (1000 UNIT) tablet Commonly known as: VITAMIN D3 Take 1,000 Units by mouth daily.   EPINEPHrine 0.3 mg/0.3 mL Soaj injection Commonly known as: EPI-PEN Inject 0.3 mg into the muscle as needed for anaphylaxis.   fexofenadine 180 MG tablet Commonly known as: ALLEGRA Take 1 tablet (180 mg total) by mouth daily.   metroNIDAZOLE 0.75 % cream Commonly known as: METROCREAM metronidazole 0.75 % topical cream   montelukast 10 MG tablet Commonly known as: SINGULAIR Take 1 tablet (10 mg total) by mouth at bedtime.   nystatin ointment Commonly known as: MYCOSTATIN Apply topically 3 (three) times daily as needed.   omeprazole 20 MG capsule Commonly known as: PRILOSEC Take 1 capsule (20 mg total) by mouth 2 (two) times daily before a meal.   Repatha SureClick  140 MG/ML Soaj Generic drug: Evolocumab Inject into the skin every 14 (fourteen) days.   triamcinolone 55 MCG/ACT Aero nasal inhaler Commonly known as: NASACORT Place 2 sprays into the nose daily.   TURMERIC PO Take 1 tablet by mouth daily.    Past Medical History:  Diagnosis Date   Allergic rhinitis    Allergy    Family history of lung cancer    Family history of pancreatic cancer    GERD (gastroesophageal reflux disease)    Hypercholesteremia    Rosacea     Past Surgical History:  Procedure Laterality Date   COLONOSCOPY     FINGER SURGERY Left    NASAL SINUS SURGERY     TONSILLECTOMY  2006   UPPER GASTROINTESTINAL ENDOSCOPY      Review of systems negative except as noted in HPI / PMHx or noted below:  Review of Systems  Constitutional: Negative.   HENT: Negative.    Eyes: Negative.   Respiratory: Negative.    Cardiovascular: Negative.   Gastrointestinal: Negative.   Genitourinary: Negative.   Musculoskeletal: Negative.   Skin: Negative.   Neurological: Negative.   Endo/Heme/Allergies: Negative.   Psychiatric/Behavioral: Negative.       Objective:   Vitals:   03/10/23 1029  BP: 126/76  Pulse: 75  Resp: 16  Temp: 98.4 F (36.9 C)  SpO2: 98%   Height: 6' 3.5" (191.8 cm)  Weight: 225 lb 12.8 oz (102.4 kg)  Physical Exam Constitutional:      Appearance: He is not diaphoretic.  HENT:     Head: Normocephalic.     Right Ear: Tympanic membrane, ear canal and external ear normal.     Left Ear: Tympanic membrane, ear canal and external ear normal.     Nose: Nose normal. No mucosal edema or rhinorrhea.     Mouth/Throat:     Pharynx: Uvula midline. No oropharyngeal exudate.  Eyes:     Conjunctiva/sclera: Conjunctivae normal.  Neck:     Thyroid: No thyromegaly.     Trachea: Trachea normal. No tracheal tenderness or tracheal deviation.  Cardiovascular:     Rate and Rhythm: Normal rate and regular rhythm.     Heart sounds: Normal heart sounds, S1  normal and S2 normal. No murmur heard. Pulmonary:     Effort: No respiratory distress.     Breath sounds: Normal breath sounds. No stridor. No wheezing or rales.  Lymphadenopathy:     Head:     Right side of head: No tonsillar adenopathy.     Left side of head: No tonsillar adenopathy.     Cervical: No cervical adenopathy.  Skin:    Findings: No erythema or rash.     Nails: There is no clubbing.  Neurological:     Mental Status: He is alert.     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 Perennial allergic rhinitis   2. Seasonal allergic rhinitis due to pollen   3. Adverse food reaction, subsequent encounter     Patient Instructions   1. Try sample of Ryaltris - 2 sprays each nostril 1-2 times per day (Nasacort)  2. Continue immunotherapy   3. Continue montelukast + Fexofenadine  4. Use Epi-pen if needed  5. Omalizumab ???  6. Return to clinic in 1 year or earlier if problem  Laurette Schimke, MD Allergy / Immunology [facility]

## 2023-03-11 ENCOUNTER — Encounter: Payer: Self-pay | Admitting: Allergy and Immunology

## 2023-05-14 DIAGNOSIS — M19011 Primary osteoarthritis, right shoulder: Secondary | ICD-10-CM | POA: Diagnosis not present

## 2023-05-20 DIAGNOSIS — Z23 Encounter for immunization: Secondary | ICD-10-CM | POA: Diagnosis not present

## 2023-09-17 DIAGNOSIS — J3089 Other allergic rhinitis: Secondary | ICD-10-CM | POA: Diagnosis not present

## 2023-09-17 NOTE — Progress Notes (Addendum)
 VIALS EXP 09-16-24

## 2023-09-22 DIAGNOSIS — R7301 Impaired fasting glucose: Secondary | ICD-10-CM | POA: Diagnosis not present

## 2023-09-22 DIAGNOSIS — E785 Hyperlipidemia, unspecified: Secondary | ICD-10-CM | POA: Diagnosis not present

## 2023-09-22 DIAGNOSIS — Z125 Encounter for screening for malignant neoplasm of prostate: Secondary | ICD-10-CM | POA: Diagnosis not present

## 2023-09-22 DIAGNOSIS — Z79899 Other long term (current) drug therapy: Secondary | ICD-10-CM | POA: Diagnosis not present

## 2023-09-22 DIAGNOSIS — R7989 Other specified abnormal findings of blood chemistry: Secondary | ICD-10-CM | POA: Diagnosis not present

## 2023-09-22 DIAGNOSIS — E538 Deficiency of other specified B group vitamins: Secondary | ICD-10-CM | POA: Diagnosis not present

## 2023-10-07 ENCOUNTER — Ambulatory Visit (INDEPENDENT_AMBULATORY_CARE_PROVIDER_SITE_OTHER): Payer: BC Managed Care – PPO | Admitting: *Deleted

## 2023-10-07 DIAGNOSIS — J309 Allergic rhinitis, unspecified: Secondary | ICD-10-CM

## 2023-10-07 NOTE — Progress Notes (Signed)
Immunotherapy   Patient Details  Name: CRISTOFHER WEITKAMP MRN: [id number] Date of Birth: 1971-02-06  10/07/2023  [facility] here to pick up  TREES, DMITE Following schedule: C  Frequency:1 time per week Epi-Pen:Epi-Pen Available  Consent signed and patient instructions given. EXP DATE OF VIALS: 09/16/2024 Patient received .10mL of TREES in the LUA and .10mL of DMITE in the RUA. Patient waited 20 minutes in office then took his vials and injection paperwork home to continue to administer his allergy injections.    Rashanda Magloire Fernandez-Vernon 10/07/2023, 9:08 AM

## 2023-10-27 ENCOUNTER — Encounter: Payer: Self-pay | Admitting: Gastroenterology

## 2023-11-18 ENCOUNTER — Encounter: Payer: Self-pay | Admitting: Neurology

## 2023-11-18 ENCOUNTER — Ambulatory Visit (INDEPENDENT_AMBULATORY_CARE_PROVIDER_SITE_OTHER): Payer: BC Managed Care – PPO | Admitting: Neurology

## 2023-11-18 VITALS — BP 118/74 | HR 77 | Ht 75.5 in | Wt 228.0 lb

## 2023-11-18 DIAGNOSIS — H1010 Acute atopic conjunctivitis, unspecified eye: Secondary | ICD-10-CM

## 2023-11-18 DIAGNOSIS — J309 Allergic rhinitis, unspecified: Secondary | ICD-10-CM | POA: Diagnosis not present

## 2023-11-18 DIAGNOSIS — R0681 Apnea, not elsewhere classified: Secondary | ICD-10-CM | POA: Diagnosis not present

## 2023-11-18 DIAGNOSIS — M2629 Other anomalies of dental arch relationship: Secondary | ICD-10-CM | POA: Insufficient documentation

## 2023-11-18 DIAGNOSIS — R0683 Snoring: Secondary | ICD-10-CM | POA: Insufficient documentation

## 2023-11-18 NOTE — Progress Notes (Addendum)
 SLEEP MEDICINE CLINIC    Provider:  Neomia Banner, Barry Daniels  Primary Care Physician:  Margarete Sharps, Barry Daniels [address] [place] [address]     Referring Provider: Margarete Sharps, Barry Daniels [address] [place],  [place] [address]          Chief Complaint according to patient   Patient presents with:     New Patient (Initial Visit)     Overall avg 8 hours of sleep at night and for the most part feels well rested. moderate + snores. Never had a SS. Wife has witnessed apnea. Wakes up frequently during the night but most part able to go back to sleep.       HISTORY OF PRESENT ILLNESS:  Barry Daniels , Barry Daniels, is a 53 y.o. male patient who is seen upon Dr Bethene Brought referral on 11/18/2023  for a sleep apnea.   Chief concern according to patient :  "    I have the pleasure of seeing Barry Georgis, Barry Daniels. on 11/18/23 . This patient and colleague is a right-handed Caucasian married male with a possible sleep disorder.      Sleep relevant medical history:  Snoring, mild BPH,  lower testosterone .  Nocturia once at night- Vivid dreams -enactment / yelling out,  Tonsillectomy at age 20, nasal septal repair, congestion remained. Arthritis.   Family medical /sleep history: No other family member on CPAP with OSA, insomnia, sleep walkers.  Father and sister died of malignancies.    Social history:  Patient is working as an Administrator, arts Barry Daniels and lives in a household with spouse, one of 2 adult daughters is living at home.  The patient used to work in shifts( night/ rotating,) but is no longer required to go to the hospital, he takes call for his medial group.  Pets are not present in the home. Tobacco use: none . ETOH use ; 2-3 / week ,  Caffeine intake in form of Coffee( 2 cups in AM ) Soda( /) Tea ( /) and no energy drinks being consumed.  Exercise in form of regular walking.       Sleep habits are as follows: The patient's dinner time is between 6.30- 7  PM.  The patient goes to bed at 10.30-11 PM and  continues to sleep for 5  hours, wakes for one bathroom break, around 4 AM.   The preferred sleep position is prone and right side ( shoulder arthritis affects this ) , with the support of 1-2 pillows.  Dreams are reportedly frequent/vivid.  The patient wakes up spontaneously at  6.16 before his alarm which is set for 6.4.  7 AM is the usual rise time. He reports feeling refreshed or restored in AM, but also symptoms such as dry mouth,  nasal congestion, overbite - mouth breather. No morning headaches.   Naps are taken seldomly - if he sleeps in daytime, a nap is lasting from 10 to 20 minutes and described as refreshing.    Review of Systems: Out of a complete 14 system review, the patient complains of only the following symptoms, and all other reviewed systems are negative.:  Fatigue, sleepiness , snoring, fragmented sleep, Insomnia, RLS, Nocturia    How likely are you to doze in the following situations: 0 = not likely, 1 = slight chance, 2 = moderate chance, 3 = high chance   Sitting and Reading? Watching Television? Sitting inactive in a public place (theater or meeting)? As a passenger in a  car for an hour without a break? Lying down in the afternoon when circumstances permit? Sitting and talking to someone? Sitting quietly after lunch without alcohol? In a car, while stopped for a few minutes in traffic?   Total = 3/ 24 points    Social History   Socioeconomic History   Marital status: Married    Spouse name: Not on file   Number of children: 2   Years of education: Not on file   Highest education level: Not on file  Occupational History   Occupation: Physician  Tobacco Use   Smoking status: Never   Smokeless tobacco: Never  Vaping Use   Vaping status: Never Used  Substance and Sexual Activity   Alcohol use: Yes    Comment: 2 per day   Drug use: Never   Sexual activity: Not on file  Other Topics Concern   Not on file  Social History Narrative   Not on file    Social Drivers of Health   Financial Resource Strain: Not on file  Food Insecurity: Not on file  Transportation Needs: Not on file  Physical Activity: Not on file  Stress: Not on file  Social Connections: Not on file    Family History  Problem Relation Age of Onset   Allergic rhinitis Mother    Hyperlipidemia Mother    Depression Mother    Liver cancer Father 79       bile duct   Hypertension Father    Hyperlipidemia Father    Pancreatic cancer Father        d. 4   Allergic rhinitis Sister    Lung cancer Sister 76   Lung cancer Maternal Aunt 72   Lung cancer Maternal Aunt 39   Lung cancer Maternal Uncle 51   Heart attack Paternal Uncle    COPD Maternal Grandmother    Dementia Maternal Grandmother    Lung cancer Maternal Grandfather 55   Heart attack Paternal Grandfather    Colon cancer Neg Hx    Esophageal cancer Neg Hx    Rectal cancer Neg Hx    Stomach cancer Neg Hx     Past Medical History:  Diagnosis Date   Allergic rhinitis    Allergy    Colon polyps    Family history of lung cancer    Family history of pancreatic cancer    GERD (gastroesophageal reflux disease)    Hypercholesteremia    Rosacea     Past Surgical History:  Procedure Laterality Date   COLONOSCOPY     FINGER SURGERY Left    NASAL SINUS SURGERY     TONSILLECTOMY  2006   UPPER GASTROINTESTINAL ENDOSCOPY       Current Outpatient Medications on File Prior to Visit  Medication Sig Dispense Refill   AMBULATORY NON FORMULARY MEDICATION Allergy injections  Once a month     cholecalciferol (VITAMIN D) 25 MCG (1000 UNIT) tablet Take 1,000 Units by mouth daily.     EPINEPHrine  0.3 mg/0.3 mL IJ SOAJ injection Inject 0.3 mg into the muscle as needed for anaphylaxis. 2 each 1   fexofenadine  (ALLEGRA ) 180 MG tablet Take 1 tablet (180 mg total) by mouth daily as needed for allergies or rhinitis (Can take an extra dose during flare ups.). 180 tablet 3   metroNIDAZOLE (METROCREAM) 0.75 % cream  metronidazole 0.75 % topical cream     montelukast  (SINGULAIR ) 10 MG tablet Take 1 tablet (10 mg total) by mouth at bedtime. 90 tablet 3  nystatin ointment (MYCOSTATIN) Apply topically 3 (three) times daily as needed.     omeprazole  (PRILOSEC) 20 MG capsule Take 1 capsule (20 mg total) by mouth 2 (two) times daily before a meal. 60 capsule 5   REPATHA SURECLICK 140 MG/ML SOAJ Inject into the skin every 14 (fourteen) days.     triamcinolone  (NASACORT ) 55 MCG/ACT AERO nasal inhaler Place 2 sprays into the nose daily. 16.5 g 1   TURMERIC PO Take 1 tablet by mouth daily.     No current facility-administered medications on file prior to visit.    Allergies  Allergen Reactions   Cashew Nut (Anacardium Occidentale) Skin Test Hives   Other Hives    Possible Cashews   Statins Other (See Comments)    Drug intolerance     DIAGNOSTIC DATA (LABS, IMAGING, TESTING) - I reviewed patient records, labs, notes, testing and imaging myself where available.  No results found for: "WBC", "HGB", "HCT", "MCV", "PLT" No results found for: "NA", "K", "CL", "CO2", "GLUCOSE", "BUN", "CREATININE", "CALCIUM", "PROT", "ALBUMIN", "AST", "ALT", "ALKPHOS", "BILITOT", "GFRNONAA", "GFRAA" No results found for: "CHOL", "HDL", "LDLCALC", "LDLDIRECT", "TRIG", "CHOLHDL" No results found for: "HGBA1C" No results found for: "VITAMINB12" No results found for: "TSH"  PHYSICAL EXAM:  Today's Vitals   11/18/23 0755  BP: 118/74  Pulse: 77  Weight: 228 lb (103.4 kg)  Height: 6' 3.5" (1.918 m)   Body mass index is 28.12 kg/m.   Wt Readings from Last 3 Encounters:  11/18/23 228 lb (103.4 kg)  03/10/23 225 lb 12.8 oz (102.4 kg)  03/05/21 221 lb (100.2 kg)     Ht Readings from Last 3 Encounters:  11/18/23 6' 3.5" (1.918 m)  03/10/23 6' 3.5" (1.918 m)  03/05/21 6\' 3"  (1.905 m)      General: The patient is awake, alert and appears not in acute distress. The patient is well groomed. Head: Normocephalic, atraumatic.  Neck is supple.  Mallampati 2,  neck circumference:16.5  inches .  Nasal airflow barely patent.   Overbite present .  Dental status: biological  Cardiovascular:  Regular rate and cardiac rhythm by pulse,  without distended neck veins. Respiratory: Lungs are clear to auscultation.  Skin:  Without evidence of ankle edema, or rash. Trunk: The patient's posture is erect.   NEUROLOGIC EXAM: The patient is awake and alert, oriented to place and time.   Memory subjective described as intact.  Attention span & concentration ability appears normal.  Speech is fluent, without dysarthria, dysphonia or aphasia.  Mood and affect are appropriate.   Cranial nerves: no loss of smell or taste reported  Pupils are equal  in shape and size- and briskly reactive to light. Funduscopic exam deferred. .  Extraocular movements in vertical and horizontal planes were intact and without nystagmus. No Diplopia. Visual fields by finger perimetry are intact. Hearing was intact to soft voice and finger rubbing.   Facial sensation intact to fine touch. Facial motor strength is symmetric and tongue and uvula move midline.  Neck ROM : rotation, tilt and flexion extension were normal for age and shoulder shrug was symmetrical.    Motor exam:  Symmetric bulk, tone and ROM.   Normal tone without cog- wheeling, symmetric grip strength - but reduced grip strength, hx of carpal tunnel.   Sensory:  Fine touch and vibration were intact.  Proprioception tested in the upper extremities was normal.   Coordination: Rapid alternating movements in the fingers/hands were of normal speed.  The Finger-to-nose maneuver was  intact .   Gait and station: Patient could rise unassisted from a seated position, walked without assistive device.  Stance is of normal width/ base and the patient turned with 3 steps ( observation) .  Toe and heel walk were deferred.  Deep tendon reflexes: in the  upper and lower extremities are symmetric and  intact.  Babinski response was deferred .     ASSESSMENT AND PLAN 53 y.o. year old male  here with:    1)  Snoring, mouth breathing, allergic and vasomotor rhinitis.   2)  Dr. Tempie Fee wife has witnessed apneas and snoring. Overbite is noted .    I plan to follow up either personally or through our NP within 3-5 months, in the meantime we will do a HST:, watch pat- or sansa -   Patient is open to both.   Please [place] study as URGENT      I would like to thank Margarete Sharps, Barry Daniels for allowing me to meet with and to take care of this pleasant patient.  After spending a total time of  35  minutes face to face and additional time for physical and neurologic examination, review of laboratory studies,  personal review of imaging studies, reports and results of other testing and review of referral information / records as far as provided in visit,   Electronically signed by: Neomia Banner, Barry Daniels 11/18/2023 8:05 AM  [facility] Board certified by The [employer] of Sleep Medicine and Diplomate of the [employer] of Sleep Medicine. Board certified In Neurology through the ABPN, Fellow of the [employer] of Neurology.

## 2023-11-18 NOTE — Patient Instructions (Addendum)
 Quality Sleep Information, Adult Quality sleep is important for your mental and physical health. It also improves your quality of life. Quality sleep means you: Are asleep for most of the time you are in bed. Fall asleep within 30 minutes. Wake up no more than once a night. Are awake for no longer than 20 minutes if you do wake up during the night. Most adults need 7-8 hours of quality sleep each night. How can poor sleep affect me? If you do not get enough quality sleep, you may have: Mood swings. Daytime sleepiness. Decreased alertness, reaction time, and concentration. Sleep disorders, such as insomnia and sleep apnea. Difficulty with: Solving problems. Coping with stress. Paying attention. These issues may affect your performance and productivity at work, school, and home. Lack of sleep may also put you at higher risk for accidents, suicide, and risky behaviors. If you do not get quality sleep, you may also be at higher risk for several health problems, including: Infections. Type 2 diabetes. Heart disease. High blood pressure. Obesity. Worsening of long-term conditions, like arthritis, kidney disease, depression, Parkinson's disease, and epilepsy. What actions can I take to get more quality sleep? Sleep schedule and routine Stick to a sleep schedule. Go to sleep and wake up at about the same time each day. Do not try to sleep less on weekdays and make up for lost sleep on weekends. This does not work. Limit naps during the day to 30 minutes or less. Do not take naps in the late afternoon. Make time to relax before bed. Reading, listening to music, or taking a hot bath promotes quality sleep. Make your bedroom a place that promotes quality sleep. Keep your bedroom dark, quiet, and at a comfortable room temperature. Make sure your bed is comfortable. Avoid using electronic devices that give off bright blue light for 30 minutes before bedtime. Your brain perceives bright blue light  as sunlight. This includes television, phones, and computers. If you are lying awake in bed for longer than 20 minutes, get up and do a relaxing activity until you feel sleepy. Lifestyle     Try to get at least 30 minutes of exercise on most days. Do not exercise 2-3 hours before going to bed. Do not use any products that contain nicotine or tobacco. These products include cigarettes, chewing tobacco, and vaping devices, such as e-cigarettes. If you need help quitting, ask your health care provider. Do not drink caffeinated beverages for at least 8 hours before going to bed. Coffee, tea, and some sodas contain caffeine. Do not drink alcohol or eat large meals close to bedtime. Try to get at least 30 minutes of sunlight every day. Morning sunlight is best. Medical concerns Work with your health care provider to treat medical conditions that may affect sleeping, such as: Nasal obstruction. Snoring. Sleep apnea and other sleep disorders. Talk to your health care provider if you think any of your prescription medicines may cause you to have difficulty falling or staying asleep. If you have sleep problems, talk with a sleep consultant. If you think you have a sleep disorder, talk with your health care provider about getting evaluated by a specialist. Where to find more information Sleep Foundation: sleepfoundation.org American Academy of Sleep Medicine: aasm.org Centers for Disease Control and Prevention (CDC): TonerPromos.no Contact a health care provider if: You have trouble getting to sleep or staying asleep. You often wake up very early in the morning and cannot get back to sleep. You have daytime sleepiness. You  have daytime sleep attacks of suddenly falling asleep and sudden muscle weakness (narcolepsy). You have a tingling sensation in your legs with a strong urge to move your legs (restless legs syndrome). You stop breathing briefly during sleep (sleep apnea). You think you have a sleep  disorder or are taking a medicine that is affecting your quality of sleep. Summary Most adults need 7-8 hours of quality sleep each night. Getting enough quality sleep is important for your mental and physical health. Make your bedroom a place that promotes quality sleep, and avoid things that may cause you to have poor sleep, such as alcohol, caffeine, smoking, or large meals. Talk to your health care provider if you have trouble falling asleep or staying asleep. This information is not intended to replace advice given to you by your health care provider. Make sure you discuss any questions you have with your health care provider. Document Revised: 12/25/2021 Document Reviewed: 12/25/2021 Elsevier Patient Education  2024 Elsevier Inc.       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   2)  wife has witnessed apneas and snoring.  Overbite .    I plan to follow up either personally or through our NP within 3-5 months.   HST , watch pat- or sansa -

## 2023-12-04 ENCOUNTER — Ambulatory Visit (INDEPENDENT_AMBULATORY_CARE_PROVIDER_SITE_OTHER): Admitting: Neurology

## 2023-12-04 DIAGNOSIS — G4733 Obstructive sleep apnea (adult) (pediatric): Secondary | ICD-10-CM | POA: Diagnosis not present

## 2023-12-04 DIAGNOSIS — R0683 Snoring: Secondary | ICD-10-CM

## 2023-12-04 DIAGNOSIS — H1010 Acute atopic conjunctivitis, unspecified eye: Secondary | ICD-10-CM

## 2023-12-04 DIAGNOSIS — R0681 Apnea, not elsewhere classified: Secondary | ICD-10-CM

## 2023-12-04 DIAGNOSIS — J309 Allergic rhinitis, unspecified: Secondary | ICD-10-CM

## 2023-12-04 DIAGNOSIS — M2629 Other anomalies of dental arch relationship: Secondary | ICD-10-CM

## 2023-12-08 ENCOUNTER — Telehealth: Payer: Self-pay

## 2023-12-08 NOTE — Telephone Encounter (Signed)
 Dr. Waynard Edwards called and returned Dr. Oliva Bustard call - said he had not received the device yet but is looking out for it. He will complete the study as soon as he receives the device. He said to please let Dr. Vickey Huger to not stress about reading his study before her trip, he was fine waiting for results when she returns. I did let Dr. Waynard Edwards know the Sansa device has been mailed out and if he does not receive it in the next few days to please call me back and let me know.

## 2023-12-31 DIAGNOSIS — M170 Bilateral primary osteoarthritis of knee: Secondary | ICD-10-CM | POA: Diagnosis not present

## 2024-01-06 NOTE — Progress Notes (Addendum)
 [facility]  Veatrice Georgis, MD   Male, 53 y.o., 1971/06/27 MRN: [id number]   HOME SLEEP TEST REPORT ( by Katheen Palma  mail -out device )    ADDENDUM  to date of service:  STUDY DATE:  12-21-2021- Error - this study order is from 12-04-2023 , study date was 12-22-2023.  Data received : 01-06-2024   ORDERING CLINICIAN: Neomia Banner, MD  REFERRING CLINICIAN: Dr Mamie Searles  CLINICAL INFORMATION/HISTORY: Witnessed episodes of apnea,  Snoring, Overbite. Allergic rhino-conjunctivitis.  Epworth sleepiness score: 3/ 24 points  BMI: 28 kg/m Neck Circumference: 16.6 "   FINDINGS: Sleep Summary:   Total Recording Time (hours, min): 8 hours and 28 minutes      Total Sleep Time (hours, min):   6 hours 48 minutes  Sleep efficiency %;   80%                                    Respiratory Indices by AASM criteria:   Calculated pAHI (per hour):   26.4/h                                              Positional  respiratory activity  / snoring : The vast majority of sleep was recorded in supine position.  Snoring was loudest in supine position and the frequency of desaturations was increased during supine sleep.  Oxygen Saturation  in Sleep    Oxygen Saturation (%) Mean: 95.2%                 O2 Saturation Range (%):   Between the nadir at 81.3% and a maximum saturation of 100% with only 3 minutes of total desaturation time.                                    O2 Saturation (minutes) <89%:    3 minutes       Pulse Rate in Sleep :   Pulse Mean (bpm) and heart rate statistics: Sinus rhythm dominated the study, but there were plenty of PVCs seen.  The mean heart rate during sleep was 66 bpm and varied between 55 and a maximal heart rate of 90 bpm.               IMPRESSION:  This HST confirms the presence of moderate severe sleep apnea with a total AHI of 26.4/h and an oxygen nadir of 81%.  Snoring was detected in the mild to moderate range of volume.  Treatment with with positive  airway pressure is highly recommended.    The patient will be advised to proceed with an AutoPap titration trial at home.  A laboratory attended titration study can be considered in the future for optimization or if tolerance and/or compliance are low, or if central apneas emerge and positive airway pressure therapy .   Alternative treatments can be considered as well.  These consist of dental device or of an inspire hypoglossal nerve stimulator treatment but I usually not able to alleviate the apnea in/ to the same degree that positive airway pressure therapy can provide. However the patient is qualified for an alternative therapy if desired -his body mass  index, lack of hypoxia and history of upper airway anatomy contributing to snoring even in years past would be treatment indications.    RECOMMENDATION: An auto titration CPAP will be ordered for the patient and a follow-up in the sleep clinic at [facility] neurologic will be provided.  The patient and his referring provider will be notified of these test results.    NTERPRETING PHYSICIAN:   Neomia Banner, MD  [facility] Sleep Board certified by The [employer] of Sleep Medicine and Diplomate of the [employer] of Sleep Medicine. Board certified In Neurology through the ABPN, Fellow of the [employer] of Neurology.     ADDENDUM  to date of service:  STUDY DATE:  12-21-2021- Error - this study order is from 12-04-2023 , study date was 12-22-2023.  Data received : 01-06-2024

## 2024-01-09 NOTE — Procedures (Signed)
 [facility]  Barry Georgis, MD   Male, 53 y.o., 1971/06/21 MRN: [id number]   HOME SLEEP TEST REPORT ( by Katheen Palma  mail -out device )   STUDY DATE:  12-21-2021 Data received : 01-06-2024   ORDERING CLINICIAN: Neomia Banner, MD  REFERRING CLINICIAN: Dr Mamie Searles  CLINICAL INFORMATION/HISTORY: Witnessed episodes of apnea,  Snoring, Overbite. Allergic rhino-conjunctivitis.  Epworth sleepiness score: 3/ 24 points  BMI: 28 kg/m Neck Circumference: 16.6 "   FINDINGS: Sleep Summary:   Total Recording Time (hours, min): 8 hours and 28 minutes      Total Sleep Time (hours, min):   6 hours 48 minutes  Sleep efficiency %;   80%                                    Respiratory Indices by AASM criteria:   Calculated pAHI (per hour):   26.4/h                                              Positional  respiratory activity  / snoring : The vast majority of sleep was recorded in supine position.  Snoring was loudest in supine position and the frequency of desaturations was increased during supine sleep.  Oxygen Saturation  in Sleep    Oxygen Saturation (%) Mean: 95.2%                 O2 Saturation Range (%):   Between the nadir at 81.3% and a maximum saturation of 100% with only 3 minutes of total desaturation time.                                    O2 Saturation (minutes) <89%:    3 minutes       Pulse Rate in Sleep :   Pulse Mean (bpm) and heart rate statistics: Sinus rhythm dominated the study, but there were plenty of PVCs seen.  The mean heart rate during sleep was 66 bpm and varied between 55 and a maximal heart rate of 90 bpm.               IMPRESSION:  This HST confirms the presence of moderate severe sleep apnea with a total AHI of 26.4/h and an oxygen nadir of 81%.  Snoring was detected in the mild to moderate range of volume.  Treatment with with positive airway pressure is highly recommended.    The patient will be advised to proceed with an AutoPap titration  trial at home.  A laboratory attended titration study can be considered in the future for optimization or if tolerance and/or compliance are low, or if central apneas emerge and positive airway pressure therapy .   Alternative treatments can be considered as well.  These consist of dental device or of an inspire hypoglossal nerve stimulator treatment but I usually not able to alleviate the apnea in/ to the same degree that positive airway pressure therapy can provide. However the patient is qualified for an alternative therapy if desired -his body mass index, lack of hypoxia and history of upper airway anatomy contributing to snoring even in years past would be treatment indications.  RECOMMENDATION: An auto titration CPAP will be ordered for the patient and a follow-up in the sleep clinic at [facility] neurologic will be provided.  The patient and his referring provider will be notified of these test results.    NTERPRETING PHYSICIAN:   Neomia Banner, MD  [facility] and [facility] Sleep Board certified by The [employer] of Sleep Medicine and Diplomate of the [employer] of [employer]. Board certified In Neurology through the ABPN, Fellow of the [employer] Neu[employer].         Gaylan Kaufman Registered Polysomnographic Technologist 01/06/2024   Edit   Copy                 [facility]   Barry Georgis, MD   Male, 53 y.o., Mar 04, 1971 MRN: [id number]   HOME SLEEP TEST REPORT ( by Katheen Palma  mail -out device )   STUDY DATE:  12-21-2021 Data received : 01-06-2024   ORDERING CLINICIAN: Neomia Banner, MD  REFERRING CLINICIAN: Dr Mamie Searles   CLINICAL INFORMATION/HISTORY: Witnessed episodes of apnea,  Snoring, Overbite. Allergic rhino-conjunctivitis.   Epworth sleepiness score: 3/ 24 points  BMI: 28 kg/m Neck Circumference: 16.6 "   FINDINGS: Sleep Summary:   Total Recording Time (hours, min): 8 hours and 28 minutes      Total  Sleep Time (hours, min):   6 hours 48 minutes   Sleep efficiency %;   80%                                    Respiratory Indices by AASM criteria:   Calculated pAHI (per hour):   26.4/h                                              Positional  respiratory activity  / snoring : The vast majority of sleep was recorded in supine position.  Snoring was loudest in supine position and the frequency of desaturations was increased during supine sleep.   Oxygen Saturation  in Sleep    Oxygen Saturation (%) Mean: 95.2%                 O2 Saturation Range (%):   Between the nadir at 81.3% and a maximum saturation of 100% with only 3 minutes of total desaturation time.                                    O2 Saturation (minutes) <89%:    3 minutes       Pulse Rate in Sleep :   Pulse Mean (bpm) and heart rate statistics: Sinus rhythm dominated the study, but there were plenty of PVCs seen.  The mean heart rate during sleep was 66 bpm and varied between 55 and a maximal heart rate of 90 bpm.               IMPRESSION:  This HST confirms the presence of moderate severe sleep apnea with a total AHI of 26.4/h and an oxygen nadir of 81%.  Snoring was detected in the mild to moderate range of volume.  Treatment with with positive airway pressure is highly recommended.     The patient will be  advised to proceed with an AutoPap titration trial at home.  A laboratory attended titration study can be considered in the future for optimization or if tolerance and/or compliance are low, or if central apneas emerge and positive airway pressure therapy .   Alternative treatments can be considered as well.  These consist of dental device or of an inspire hypoglossal nerve stimulator treatment but I usually not able to alleviate the apnea in/ to the same degree that positive airway pressure therapy can provide. However the patient is qualified for an alternative therapy if desired -his body mass index, lack of hypoxia and  history of upper airway anatomy contributing to snoring even in years past would be treatment indications.    RECOMMENDATION: An auto titration CPAP will be ordered for the patient and a follow-up in the sleep clinic at [facility] neurologic will be provided.  The patient and his referring provider will be notified of these test results.     NTERPRETING PHYSICIAN:    Neomia Banner, MD  [facility] and [facility] Sleep Board certified by [employer] of Sleep Medicine and Diplomate of the [employer]. Board certified In Neurology through the ABPN, Fellow of the [employer].

## 2024-01-29 ENCOUNTER — Ambulatory Visit: Payer: Self-pay | Admitting: Neurology

## 2024-01-29 NOTE — Telephone Encounter (Signed)
-----   Message from [place] Dohmeier sent at 01/09/2024  4:01 PM EDT ----- Moderate and all -obstructive sleep apnea.  PVCs. No hypoxia.   Ordered CPAP autotitration device. Mask should be fitted in reclined position.  Rv in 2-3 months.   CD

## 2024-01-29 NOTE — Telephone Encounter (Signed)
 Called the pt to review sleep study results. There was no answer. LVM for him to call back

## 2024-02-03 NOTE — Telephone Encounter (Signed)
 Called the patient and reviewed the sleep study results. Advised that the order will be sent to [facility]. Reviewed the CPAP compliance with him. Pt will reach out if he chooses to continue to move forward with CPAP.  Patient made aware of the compliance visit needing to follow-up within 31 to 90 days of starting the CPAP machine.  Patient will reach out to schedule this once he is set up.

## 2024-02-17 ENCOUNTER — Encounter: Payer: Self-pay | Admitting: Neurology

## 2024-05-10 ENCOUNTER — Telehealth: Payer: Self-pay | Admitting: Neurology

## 2024-05-10 NOTE — Telephone Encounter (Signed)
 [facility] has made multiple attempts to contact pt re: the medical equipment and services ordered.  They have been unsuccessful in reaching pt.   A letter has been mailed to pt in an attempt to reach pt.  If there are questions on this DME states they can be reached at [phone], letter dated 05/09/24

## 2024-05-31 ENCOUNTER — Telehealth: Payer: Self-pay | Admitting: Neurology

## 2024-05-31 NOTE — Telephone Encounter (Signed)
 Ok, can you contact him and explain. I dont know why he is req a in office study. Insurance will liekly not cover it anyway since he just had HST, correct?

## 2024-05-31 NOTE — Telephone Encounter (Signed)
 Called patient to get a clarification about what he is requesting. No answer, lvm to return my call.

## 2024-05-31 NOTE — Telephone Encounter (Signed)
 Patient returned phone call, transferred to Me[place] in Sleep Lab.

## 2024-05-31 NOTE — Telephone Encounter (Signed)
 Pt has called to request an in office sleep study and not a home sleep study. Please call to discuss.

## 2024-05-31 NOTE — Telephone Encounter (Signed)
 Patient called back stating that he is unsure about starting CPAP at this time. He is requesting to do an inlab study in January 2026. He also understands that his inusrance will not approve for the inlab study due to just having a valid HST in 11/2023. He agrees and understand that he will have to pay out of pocket for this. Asked Angie, [facility] and she confirmed his portion would be $638.75 ( 65% off of $1825). Patient stated that due to his busy schedule, he would like to schedule for January 12th at 9pm.  Dr Dohmeier, would you like to proceed with ordering the inlab sleep study?

## 2024-06-01 DIAGNOSIS — Z23 Encounter for immunization: Secondary | ICD-10-CM | POA: Diagnosis not present

## 2024-06-02 ENCOUNTER — Other Ambulatory Visit: Payer: Self-pay | Admitting: Neurology

## 2024-06-02 DIAGNOSIS — R0683 Snoring: Secondary | ICD-10-CM

## 2024-06-02 DIAGNOSIS — H1010 Acute atopic conjunctivitis, unspecified eye: Secondary | ICD-10-CM

## 2024-06-02 DIAGNOSIS — R0681 Apnea, not elsewhere classified: Secondary | ICD-10-CM

## 2024-06-02 DIAGNOSIS — M2629 Other anomalies of dental arch relationship: Secondary | ICD-10-CM

## 2024-06-02 NOTE — Telephone Encounter (Signed)
 Noted, thank you. I have linked the in lab order to the patient appointment.

## 2024-06-02 NOTE — Progress Notes (Signed)
 Upon Dr Wempe's request , I ordered in lab sleep study ( SPLIT if AHI 40 /h or higher) .    Dedra Gores, MD  [facility] and [facility] Board certified by The [employer] of Sleep Medicine and Diplomate of the [employer] Sleep Medicine. Board certified In Neurology through the ABPN, Fellow of the [employer] Neu[employer].

## 2024-06-15 DIAGNOSIS — Z23 Encounter for immunization: Secondary | ICD-10-CM | POA: Diagnosis not present

## 2024-07-04 DIAGNOSIS — E538 Deficiency of other specified B group vitamins: Secondary | ICD-10-CM | POA: Diagnosis not present

## 2024-07-04 DIAGNOSIS — R7301 Impaired fasting glucose: Secondary | ICD-10-CM | POA: Diagnosis not present

## 2024-07-04 DIAGNOSIS — R030 Elevated blood-pressure reading, without diagnosis of hypertension: Secondary | ICD-10-CM | POA: Diagnosis not present

## 2024-07-04 DIAGNOSIS — E785 Hyperlipidemia, unspecified: Secondary | ICD-10-CM | POA: Diagnosis not present

## 2024-07-04 DIAGNOSIS — Z125 Encounter for screening for malignant neoplasm of prostate: Secondary | ICD-10-CM | POA: Diagnosis not present

## 2024-07-04 DIAGNOSIS — R7989 Other specified abnormal findings of blood chemistry: Secondary | ICD-10-CM | POA: Diagnosis not present

## 2024-07-21 DIAGNOSIS — J301 Allergic rhinitis due to pollen: Secondary | ICD-10-CM | POA: Diagnosis not present

## 2024-07-21 DIAGNOSIS — J3089 Other allergic rhinitis: Secondary | ICD-10-CM | POA: Diagnosis not present

## 2024-07-21 NOTE — Progress Notes (Signed)
 VIALS MADE ON 07/21/24

## 2024-08-05 ENCOUNTER — Ambulatory Visit: Admitting: Internal Medicine

## 2024-08-05 ENCOUNTER — Ambulatory Visit

## 2024-09-24 ENCOUNTER — Encounter: Payer: Self-pay | Admitting: Gastroenterology

## 2024-09-30 ENCOUNTER — Encounter

## 2025-01-17 ENCOUNTER — Ambulatory Visit: Admitting: Allergy and Immunology
